# Patient Record
Sex: Male | Born: 1935 | Race: White | Hispanic: No | Marital: Married | Smoking: Former smoker
Health system: Southern US, Community
[De-identification: ages and names within clinical notes are randomized; demographics above are authoritative.]

## PROBLEM LIST (undated history)

## (undated) DIAGNOSIS — E785 Hyperlipidemia, unspecified: Secondary | ICD-10-CM

## (undated) DIAGNOSIS — I10 Essential (primary) hypertension: Secondary | ICD-10-CM

## (undated) HISTORY — DX: Essential (primary) hypertension: I10

## (undated) HISTORY — DX: Hyperlipidemia, unspecified: E78.5

## (undated) HISTORY — PX: BACK SURGERY: SHX140

---

## 2011-05-13 DIAGNOSIS — M25519 Pain in unspecified shoulder: Secondary | ICD-10-CM | POA: Diagnosis not present

## 2011-05-15 DIAGNOSIS — M25519 Pain in unspecified shoulder: Secondary | ICD-10-CM | POA: Diagnosis not present

## 2011-05-16 DIAGNOSIS — Z23 Encounter for immunization: Secondary | ICD-10-CM | POA: Diagnosis not present

## 2011-05-22 DIAGNOSIS — M25519 Pain in unspecified shoulder: Secondary | ICD-10-CM | POA: Diagnosis not present

## 2011-05-26 DIAGNOSIS — M25519 Pain in unspecified shoulder: Secondary | ICD-10-CM | POA: Diagnosis not present

## 2011-05-30 DIAGNOSIS — M25519 Pain in unspecified shoulder: Secondary | ICD-10-CM | POA: Diagnosis not present

## 2011-06-02 DIAGNOSIS — M25569 Pain in unspecified knee: Secondary | ICD-10-CM | POA: Diagnosis not present

## 2011-06-02 DIAGNOSIS — M25519 Pain in unspecified shoulder: Secondary | ICD-10-CM | POA: Diagnosis not present

## 2011-06-05 DIAGNOSIS — M25519 Pain in unspecified shoulder: Secondary | ICD-10-CM | POA: Diagnosis not present

## 2011-06-09 DIAGNOSIS — M25519 Pain in unspecified shoulder: Secondary | ICD-10-CM | POA: Diagnosis not present

## 2011-06-10 DIAGNOSIS — E785 Hyperlipidemia, unspecified: Secondary | ICD-10-CM | POA: Diagnosis not present

## 2011-06-10 DIAGNOSIS — Z125 Encounter for screening for malignant neoplasm of prostate: Secondary | ICD-10-CM | POA: Diagnosis not present

## 2011-06-10 DIAGNOSIS — I10 Essential (primary) hypertension: Secondary | ICD-10-CM | POA: Diagnosis not present

## 2011-06-12 DIAGNOSIS — M25519 Pain in unspecified shoulder: Secondary | ICD-10-CM | POA: Diagnosis not present

## 2011-06-17 DIAGNOSIS — E669 Obesity, unspecified: Secondary | ICD-10-CM | POA: Diagnosis not present

## 2011-06-17 DIAGNOSIS — Z0000 Encounter for general adult medical examination without abnormal findings: Secondary | ICD-10-CM | POA: Diagnosis not present

## 2011-06-17 DIAGNOSIS — R5381 Other malaise: Secondary | ICD-10-CM | POA: Diagnosis not present

## 2011-06-17 DIAGNOSIS — R5383 Other fatigue: Secondary | ICD-10-CM | POA: Diagnosis not present

## 2011-06-17 DIAGNOSIS — Z125 Encounter for screening for malignant neoplasm of prostate: Secondary | ICD-10-CM | POA: Diagnosis not present

## 2011-06-18 DIAGNOSIS — Z1212 Encounter for screening for malignant neoplasm of rectum: Secondary | ICD-10-CM | POA: Diagnosis not present

## 2011-10-30 DIAGNOSIS — H40059 Ocular hypertension, unspecified eye: Secondary | ICD-10-CM | POA: Diagnosis not present

## 2011-10-30 DIAGNOSIS — H01009 Unspecified blepharitis unspecified eye, unspecified eyelid: Secondary | ICD-10-CM | POA: Diagnosis not present

## 2011-10-30 DIAGNOSIS — H40019 Open angle with borderline findings, low risk, unspecified eye: Secondary | ICD-10-CM | POA: Diagnosis not present

## 2011-10-30 DIAGNOSIS — H259 Unspecified age-related cataract: Secondary | ICD-10-CM | POA: Diagnosis not present

## 2011-10-31 DIAGNOSIS — H40059 Ocular hypertension, unspecified eye: Secondary | ICD-10-CM | POA: Diagnosis not present

## 2011-12-29 DIAGNOSIS — H401490 Capsular glaucoma with pseudoexfoliation of lens, unspecified eye, stage unspecified: Secondary | ICD-10-CM | POA: Diagnosis not present

## 2011-12-29 DIAGNOSIS — H5340 Unspecified visual field defects: Secondary | ICD-10-CM | POA: Diagnosis not present

## 2011-12-29 DIAGNOSIS — H409 Unspecified glaucoma: Secondary | ICD-10-CM | POA: Diagnosis not present

## 2011-12-29 DIAGNOSIS — H259 Unspecified age-related cataract: Secondary | ICD-10-CM | POA: Diagnosis not present

## 2012-01-28 DIAGNOSIS — H409 Unspecified glaucoma: Secondary | ICD-10-CM | POA: Diagnosis not present

## 2012-01-28 DIAGNOSIS — H4011X0 Primary open-angle glaucoma, stage unspecified: Secondary | ICD-10-CM | POA: Diagnosis not present

## 2012-01-28 DIAGNOSIS — T887XXA Unspecified adverse effect of drug or medicament, initial encounter: Secondary | ICD-10-CM | POA: Diagnosis not present

## 2012-01-28 DIAGNOSIS — H268 Other specified cataract: Secondary | ICD-10-CM | POA: Diagnosis not present

## 2012-05-20 DIAGNOSIS — H04229 Epiphora due to insufficient drainage, unspecified lacrimal gland: Secondary | ICD-10-CM | POA: Diagnosis not present

## 2012-05-20 DIAGNOSIS — H353290 Exudative age-related macular degeneration, unspecified eye, stage unspecified: Secondary | ICD-10-CM | POA: Diagnosis not present

## 2012-05-20 DIAGNOSIS — H16109 Unspecified superficial keratitis, unspecified eye: Secondary | ICD-10-CM | POA: Diagnosis not present

## 2012-05-20 DIAGNOSIS — H01009 Unspecified blepharitis unspecified eye, unspecified eyelid: Secondary | ICD-10-CM | POA: Diagnosis not present

## 2012-10-14 DIAGNOSIS — H04229 Epiphora due to insufficient drainage, unspecified lacrimal gland: Secondary | ICD-10-CM | POA: Diagnosis not present

## 2012-10-14 DIAGNOSIS — H4011X0 Primary open-angle glaucoma, stage unspecified: Secondary | ICD-10-CM | POA: Diagnosis not present

## 2012-10-14 DIAGNOSIS — H409 Unspecified glaucoma: Secondary | ICD-10-CM | POA: Diagnosis not present

## 2012-10-14 DIAGNOSIS — H259 Unspecified age-related cataract: Secondary | ICD-10-CM | POA: Diagnosis not present

## 2013-04-13 DIAGNOSIS — H409 Unspecified glaucoma: Secondary | ICD-10-CM | POA: Diagnosis not present

## 2013-04-13 DIAGNOSIS — H4011X0 Primary open-angle glaucoma, stage unspecified: Secondary | ICD-10-CM | POA: Diagnosis not present

## 2013-09-07 DIAGNOSIS — M545 Low back pain, unspecified: Secondary | ICD-10-CM | POA: Diagnosis not present

## 2013-09-07 DIAGNOSIS — M48061 Spinal stenosis, lumbar region without neurogenic claudication: Secondary | ICD-10-CM | POA: Diagnosis not present

## 2013-09-07 DIAGNOSIS — M5137 Other intervertebral disc degeneration, lumbosacral region: Secondary | ICD-10-CM | POA: Diagnosis not present

## 2013-09-28 DIAGNOSIS — H4011X0 Primary open-angle glaucoma, stage unspecified: Secondary | ICD-10-CM | POA: Diagnosis not present

## 2013-09-28 DIAGNOSIS — H409 Unspecified glaucoma: Secondary | ICD-10-CM | POA: Diagnosis not present

## 2013-09-29 DIAGNOSIS — M5137 Other intervertebral disc degeneration, lumbosacral region: Secondary | ICD-10-CM | POA: Diagnosis not present

## 2013-09-29 DIAGNOSIS — M545 Low back pain, unspecified: Secondary | ICD-10-CM | POA: Diagnosis not present

## 2013-09-29 DIAGNOSIS — M48061 Spinal stenosis, lumbar region without neurogenic claudication: Secondary | ICD-10-CM | POA: Diagnosis not present

## 2013-11-08 DIAGNOSIS — E785 Hyperlipidemia, unspecified: Secondary | ICD-10-CM | POA: Diagnosis not present

## 2013-11-08 DIAGNOSIS — Z1212 Encounter for screening for malignant neoplasm of rectum: Secondary | ICD-10-CM | POA: Diagnosis not present

## 2013-11-08 DIAGNOSIS — I10 Essential (primary) hypertension: Secondary | ICD-10-CM | POA: Diagnosis not present

## 2013-11-08 DIAGNOSIS — Z125 Encounter for screening for malignant neoplasm of prostate: Secondary | ICD-10-CM | POA: Diagnosis not present

## 2013-11-14 DIAGNOSIS — Z1212 Encounter for screening for malignant neoplasm of rectum: Secondary | ICD-10-CM | POA: Diagnosis not present

## 2013-11-15 DIAGNOSIS — E785 Hyperlipidemia, unspecified: Secondary | ICD-10-CM | POA: Diagnosis not present

## 2013-11-15 DIAGNOSIS — I10 Essential (primary) hypertension: Secondary | ICD-10-CM | POA: Diagnosis not present

## 2013-11-15 DIAGNOSIS — M1990 Unspecified osteoarthritis, unspecified site: Secondary | ICD-10-CM | POA: Diagnosis not present

## 2013-11-15 DIAGNOSIS — Z0000 Encounter for general adult medical examination without abnormal findings: Secondary | ICD-10-CM | POA: Diagnosis not present

## 2013-11-15 DIAGNOSIS — Z23 Encounter for immunization: Secondary | ICD-10-CM | POA: Diagnosis not present

## 2013-11-15 DIAGNOSIS — N529 Male erectile dysfunction, unspecified: Secondary | ICD-10-CM | POA: Diagnosis not present

## 2013-11-15 DIAGNOSIS — K5730 Diverticulosis of large intestine without perforation or abscess without bleeding: Secondary | ICD-10-CM | POA: Diagnosis not present

## 2013-11-15 DIAGNOSIS — E669 Obesity, unspecified: Secondary | ICD-10-CM | POA: Diagnosis not present

## 2013-11-15 DIAGNOSIS — Z1331 Encounter for screening for depression: Secondary | ICD-10-CM | POA: Diagnosis not present

## 2013-11-15 DIAGNOSIS — Z125 Encounter for screening for malignant neoplasm of prostate: Secondary | ICD-10-CM | POA: Diagnosis not present

## 2014-03-01 DIAGNOSIS — S161XXA Strain of muscle, fascia and tendon at neck level, initial encounter: Secondary | ICD-10-CM | POA: Diagnosis not present

## 2014-03-01 DIAGNOSIS — M542 Cervicalgia: Secondary | ICD-10-CM | POA: Diagnosis not present

## 2014-03-02 DIAGNOSIS — Z23 Encounter for immunization: Secondary | ICD-10-CM | POA: Diagnosis not present

## 2014-03-09 DIAGNOSIS — H2589 Other age-related cataract: Secondary | ICD-10-CM | POA: Diagnosis not present

## 2014-03-09 DIAGNOSIS — H4011X1 Primary open-angle glaucoma, mild stage: Secondary | ICD-10-CM | POA: Diagnosis not present

## 2014-03-09 DIAGNOSIS — H04123 Dry eye syndrome of bilateral lacrimal glands: Secondary | ICD-10-CM | POA: Diagnosis not present

## 2014-03-09 DIAGNOSIS — H2513 Age-related nuclear cataract, bilateral: Secondary | ICD-10-CM | POA: Diagnosis not present

## 2014-03-10 DIAGNOSIS — M436 Torticollis: Secondary | ICD-10-CM | POA: Diagnosis not present

## 2014-03-21 DIAGNOSIS — M436 Torticollis: Secondary | ICD-10-CM | POA: Diagnosis not present

## 2014-03-27 DIAGNOSIS — M5030 Other cervical disc degeneration, unspecified cervical region: Secondary | ICD-10-CM | POA: Diagnosis not present

## 2014-03-27 DIAGNOSIS — M436 Torticollis: Secondary | ICD-10-CM | POA: Diagnosis not present

## 2014-03-27 DIAGNOSIS — M542 Cervicalgia: Secondary | ICD-10-CM | POA: Diagnosis not present

## 2014-04-13 DIAGNOSIS — H40052 Ocular hypertension, left eye: Secondary | ICD-10-CM | POA: Diagnosis not present

## 2014-04-13 DIAGNOSIS — H4011X1 Primary open-angle glaucoma, mild stage: Secondary | ICD-10-CM | POA: Diagnosis not present

## 2014-04-13 DIAGNOSIS — H2513 Age-related nuclear cataract, bilateral: Secondary | ICD-10-CM | POA: Diagnosis not present

## 2014-05-25 DIAGNOSIS — H01001 Unspecified blepharitis right upper eyelid: Secondary | ICD-10-CM | POA: Diagnosis not present

## 2014-05-25 DIAGNOSIS — H01004 Unspecified blepharitis left upper eyelid: Secondary | ICD-10-CM | POA: Diagnosis not present

## 2014-05-25 DIAGNOSIS — H2513 Age-related nuclear cataract, bilateral: Secondary | ICD-10-CM | POA: Diagnosis not present

## 2014-05-25 DIAGNOSIS — H4011X1 Primary open-angle glaucoma, mild stage: Secondary | ICD-10-CM | POA: Diagnosis not present

## 2014-07-10 DIAGNOSIS — H4011X1 Primary open-angle glaucoma, mild stage: Secondary | ICD-10-CM | POA: Diagnosis not present

## 2014-07-10 DIAGNOSIS — H16102 Unspecified superficial keratitis, left eye: Secondary | ICD-10-CM | POA: Diagnosis not present

## 2014-07-10 DIAGNOSIS — H2589 Other age-related cataract: Secondary | ICD-10-CM | POA: Diagnosis not present

## 2014-07-10 DIAGNOSIS — H2513 Age-related nuclear cataract, bilateral: Secondary | ICD-10-CM | POA: Diagnosis not present

## 2014-10-23 DIAGNOSIS — H4011X1 Primary open-angle glaucoma, mild stage: Secondary | ICD-10-CM | POA: Diagnosis not present

## 2014-10-23 DIAGNOSIS — H40051 Ocular hypertension, right eye: Secondary | ICD-10-CM | POA: Diagnosis not present

## 2014-10-23 DIAGNOSIS — H1131 Conjunctival hemorrhage, right eye: Secondary | ICD-10-CM | POA: Diagnosis not present

## 2014-11-21 DIAGNOSIS — E785 Hyperlipidemia, unspecified: Secondary | ICD-10-CM | POA: Diagnosis not present

## 2014-11-21 DIAGNOSIS — I10 Essential (primary) hypertension: Secondary | ICD-10-CM | POA: Diagnosis not present

## 2014-11-21 DIAGNOSIS — Z125 Encounter for screening for malignant neoplasm of prostate: Secondary | ICD-10-CM | POA: Diagnosis not present

## 2014-11-28 ENCOUNTER — Other Ambulatory Visit: Payer: Self-pay | Admitting: Internal Medicine

## 2014-11-28 DIAGNOSIS — E669 Obesity, unspecified: Secondary | ICD-10-CM | POA: Diagnosis not present

## 2014-11-28 DIAGNOSIS — Z1389 Encounter for screening for other disorder: Secondary | ICD-10-CM | POA: Diagnosis not present

## 2014-11-28 DIAGNOSIS — H40009 Preglaucoma, unspecified, unspecified eye: Secondary | ICD-10-CM | POA: Diagnosis not present

## 2014-11-28 DIAGNOSIS — E785 Hyperlipidemia, unspecified: Secondary | ICD-10-CM | POA: Diagnosis not present

## 2014-11-28 DIAGNOSIS — Z0000 Encounter for general adult medical examination without abnormal findings: Secondary | ICD-10-CM | POA: Diagnosis not present

## 2014-11-28 DIAGNOSIS — I10 Essential (primary) hypertension: Secondary | ICD-10-CM | POA: Diagnosis not present

## 2014-11-28 DIAGNOSIS — M1990 Unspecified osteoarthritis, unspecified site: Secondary | ICD-10-CM | POA: Diagnosis not present

## 2014-11-28 DIAGNOSIS — H6122 Impacted cerumen, left ear: Secondary | ICD-10-CM | POA: Diagnosis not present

## 2014-11-28 DIAGNOSIS — N529 Male erectile dysfunction, unspecified: Secondary | ICD-10-CM | POA: Diagnosis not present

## 2014-11-28 DIAGNOSIS — Z139 Encounter for screening, unspecified: Secondary | ICD-10-CM

## 2014-11-28 DIAGNOSIS — Z6831 Body mass index (BMI) 31.0-31.9, adult: Secondary | ICD-10-CM | POA: Diagnosis not present

## 2014-12-01 ENCOUNTER — Ambulatory Visit
Admission: RE | Admit: 2014-12-01 | Discharge: 2014-12-01 | Disposition: A | Discharge status: Home / Self Care | Payer: No Typology Code available for payment source | Source: Ambulatory Visit | Attending: Internal Medicine | Admitting: Internal Medicine

## 2014-12-01 DIAGNOSIS — Z139 Encounter for screening, unspecified: Secondary | ICD-10-CM

## 2014-12-01 DIAGNOSIS — Z1212 Encounter for screening for malignant neoplasm of rectum: Secondary | ICD-10-CM | POA: Diagnosis not present

## 2015-01-20 DIAGNOSIS — Z23 Encounter for immunization: Secondary | ICD-10-CM | POA: Diagnosis not present

## 2015-03-02 DIAGNOSIS — H01001 Unspecified blepharitis right upper eyelid: Secondary | ICD-10-CM | POA: Diagnosis not present

## 2015-03-02 DIAGNOSIS — H25813 Combined forms of age-related cataract, bilateral: Secondary | ICD-10-CM | POA: Diagnosis not present

## 2015-03-02 DIAGNOSIS — H401411 Capsular glaucoma with pseudoexfoliation of lens, right eye, mild stage: Secondary | ICD-10-CM | POA: Diagnosis not present

## 2015-03-02 DIAGNOSIS — H401422 Capsular glaucoma with pseudoexfoliation of lens, left eye, moderate stage: Secondary | ICD-10-CM | POA: Diagnosis not present

## 2015-04-02 DIAGNOSIS — H401431 Capsular glaucoma with pseudoexfoliation of lens, bilateral, mild stage: Secondary | ICD-10-CM | POA: Diagnosis not present

## 2015-04-02 DIAGNOSIS — H01001 Unspecified blepharitis right upper eyelid: Secondary | ICD-10-CM | POA: Diagnosis not present

## 2015-04-02 DIAGNOSIS — H25813 Combined forms of age-related cataract, bilateral: Secondary | ICD-10-CM | POA: Diagnosis not present

## 2015-04-02 DIAGNOSIS — H04123 Dry eye syndrome of bilateral lacrimal glands: Secondary | ICD-10-CM | POA: Diagnosis not present

## 2015-04-25 DIAGNOSIS — H401412 Capsular glaucoma with pseudoexfoliation of lens, right eye, moderate stage: Secondary | ICD-10-CM | POA: Diagnosis not present

## 2015-04-25 DIAGNOSIS — H401411 Capsular glaucoma with pseudoexfoliation of lens, right eye, mild stage: Secondary | ICD-10-CM | POA: Diagnosis not present

## 2015-04-25 DIAGNOSIS — H25813 Combined forms of age-related cataract, bilateral: Secondary | ICD-10-CM | POA: Diagnosis not present

## 2015-06-04 DIAGNOSIS — H5213 Myopia, bilateral: Secondary | ICD-10-CM | POA: Diagnosis not present

## 2015-06-04 DIAGNOSIS — H401431 Capsular glaucoma with pseudoexfoliation of lens, bilateral, mild stage: Secondary | ICD-10-CM | POA: Diagnosis not present

## 2015-06-04 DIAGNOSIS — H524 Presbyopia: Secondary | ICD-10-CM | POA: Diagnosis not present

## 2015-06-04 DIAGNOSIS — H25813 Combined forms of age-related cataract, bilateral: Secondary | ICD-10-CM | POA: Diagnosis not present

## 2015-10-08 DIAGNOSIS — H401431 Capsular glaucoma with pseudoexfoliation of lens, bilateral, mild stage: Secondary | ICD-10-CM | POA: Diagnosis not present

## 2015-12-31 DIAGNOSIS — H16102 Unspecified superficial keratitis, left eye: Secondary | ICD-10-CM | POA: Diagnosis not present

## 2015-12-31 DIAGNOSIS — H04123 Dry eye syndrome of bilateral lacrimal glands: Secondary | ICD-10-CM | POA: Diagnosis not present

## 2015-12-31 DIAGNOSIS — H401431 Capsular glaucoma with pseudoexfoliation of lens, bilateral, mild stage: Secondary | ICD-10-CM | POA: Diagnosis not present

## 2016-01-31 DIAGNOSIS — I10 Essential (primary) hypertension: Secondary | ICD-10-CM | POA: Diagnosis not present

## 2016-01-31 DIAGNOSIS — Z125 Encounter for screening for malignant neoplasm of prostate: Secondary | ICD-10-CM | POA: Diagnosis not present

## 2016-01-31 DIAGNOSIS — E784 Other hyperlipidemia: Secondary | ICD-10-CM | POA: Diagnosis not present

## 2016-02-06 DIAGNOSIS — Z1212 Encounter for screening for malignant neoplasm of rectum: Secondary | ICD-10-CM | POA: Diagnosis not present

## 2016-02-07 DIAGNOSIS — H40009 Preglaucoma, unspecified, unspecified eye: Secondary | ICD-10-CM | POA: Diagnosis not present

## 2016-02-07 DIAGNOSIS — I10 Essential (primary) hypertension: Secondary | ICD-10-CM | POA: Diagnosis not present

## 2016-02-07 DIAGNOSIS — Z1389 Encounter for screening for other disorder: Secondary | ICD-10-CM | POA: Diagnosis not present

## 2016-02-07 DIAGNOSIS — M1990 Unspecified osteoarthritis, unspecified site: Secondary | ICD-10-CM | POA: Diagnosis not present

## 2016-02-07 DIAGNOSIS — E784 Other hyperlipidemia: Secondary | ICD-10-CM | POA: Diagnosis not present

## 2016-02-07 DIAGNOSIS — N528 Other male erectile dysfunction: Secondary | ICD-10-CM | POA: Diagnosis not present

## 2016-02-07 DIAGNOSIS — K5790 Diverticulosis of intestine, part unspecified, without perforation or abscess without bleeding: Secondary | ICD-10-CM | POA: Diagnosis not present

## 2016-02-07 DIAGNOSIS — Z0000 Encounter for general adult medical examination without abnormal findings: Secondary | ICD-10-CM | POA: Diagnosis not present

## 2016-02-07 DIAGNOSIS — Z6832 Body mass index (BMI) 32.0-32.9, adult: Secondary | ICD-10-CM | POA: Diagnosis not present

## 2016-02-07 DIAGNOSIS — L308 Other specified dermatitis: Secondary | ICD-10-CM | POA: Diagnosis not present

## 2016-02-07 DIAGNOSIS — E668 Other obesity: Secondary | ICD-10-CM | POA: Diagnosis not present

## 2016-02-07 DIAGNOSIS — Z23 Encounter for immunization: Secondary | ICD-10-CM | POA: Diagnosis not present

## 2016-05-15 DIAGNOSIS — H25813 Combined forms of age-related cataract, bilateral: Secondary | ICD-10-CM | POA: Diagnosis not present

## 2016-05-15 DIAGNOSIS — H401431 Capsular glaucoma with pseudoexfoliation of lens, bilateral, mild stage: Secondary | ICD-10-CM | POA: Diagnosis not present

## 2016-05-15 DIAGNOSIS — Z0100 Encounter for examination of eyes and vision without abnormal findings: Secondary | ICD-10-CM | POA: Diagnosis not present

## 2016-05-15 DIAGNOSIS — H04123 Dry eye syndrome of bilateral lacrimal glands: Secondary | ICD-10-CM | POA: Diagnosis not present

## 2016-06-12 DIAGNOSIS — H25813 Combined forms of age-related cataract, bilateral: Secondary | ICD-10-CM | POA: Diagnosis not present

## 2016-06-12 DIAGNOSIS — H401431 Capsular glaucoma with pseudoexfoliation of lens, bilateral, mild stage: Secondary | ICD-10-CM | POA: Diagnosis not present

## 2016-07-10 DIAGNOSIS — H25813 Combined forms of age-related cataract, bilateral: Secondary | ICD-10-CM | POA: Diagnosis not present

## 2016-07-10 DIAGNOSIS — H401431 Capsular glaucoma with pseudoexfoliation of lens, bilateral, mild stage: Secondary | ICD-10-CM | POA: Diagnosis not present

## 2016-11-18 DIAGNOSIS — H401431 Capsular glaucoma with pseudoexfoliation of lens, bilateral, mild stage: Secondary | ICD-10-CM | POA: Diagnosis not present

## 2016-12-01 DIAGNOSIS — H25813 Combined forms of age-related cataract, bilateral: Secondary | ICD-10-CM | POA: Diagnosis not present

## 2016-12-01 DIAGNOSIS — H5340 Unspecified visual field defects: Secondary | ICD-10-CM | POA: Diagnosis not present

## 2016-12-01 DIAGNOSIS — H401432 Capsular glaucoma with pseudoexfoliation of lens, bilateral, moderate stage: Secondary | ICD-10-CM | POA: Diagnosis not present

## 2016-12-07 IMAGING — CT CT HEART SCORING
1 of 2 series · 11 of 20 positions shown, 14 images · non-contrast
Comparison: No priors.

CLINICAL DATA: 80-year-old male with family history of coronary
artery disease in father and sister. Former smoker (quit 35 x 40
years ago).

EXAM:
CT HEART FOR CALCIUM SCORING
TECHNIQUE: CT heart was performed on a 64 channel system using prospective ECG
gating. A scout and noncontrast exam (for calcium scoring) were
performed. Note that this exam targets the heart and the chest was
not imaged in its entirety.

[Series 2: smartscore - gated 0.4 sec · axial · 0.55mm/px · z∈[-256,-141]mm · 11 of 56 slices shown, 14 images]
[im 5/56  vessel]
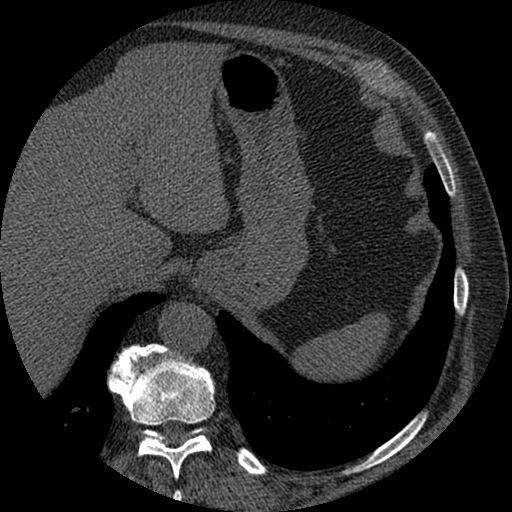
[im 5/56  lung]
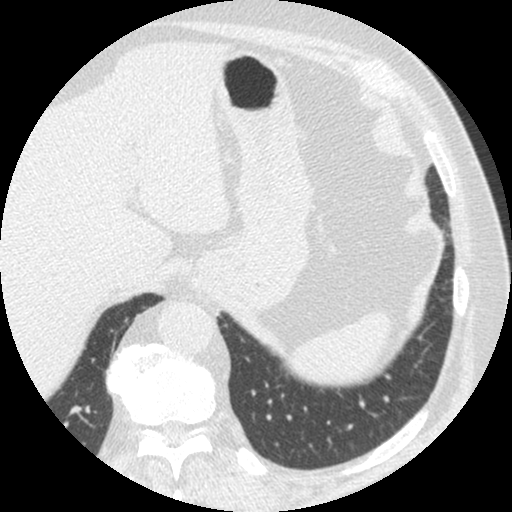
[im 10/56  vessel]
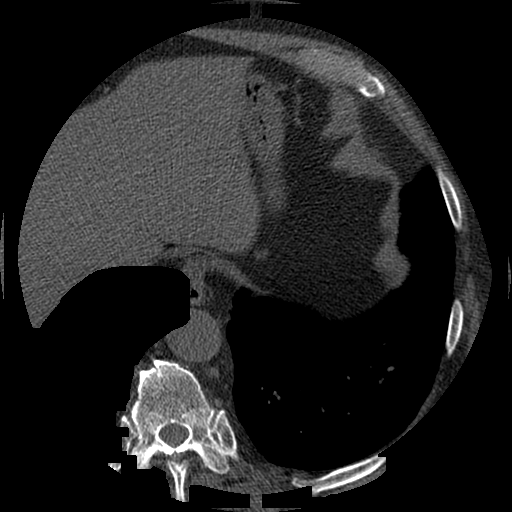
[im 14/56  vessel]
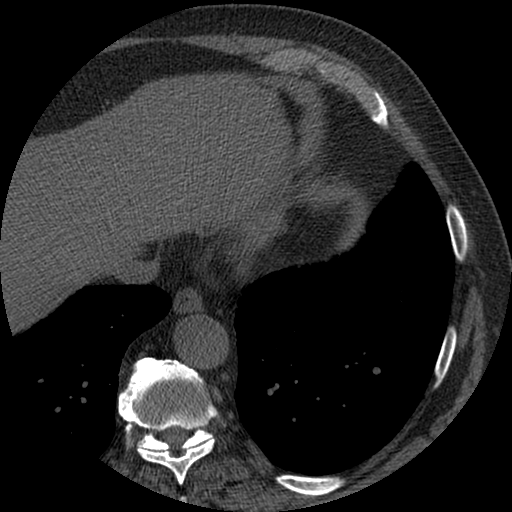
[im 19/56  vessel]
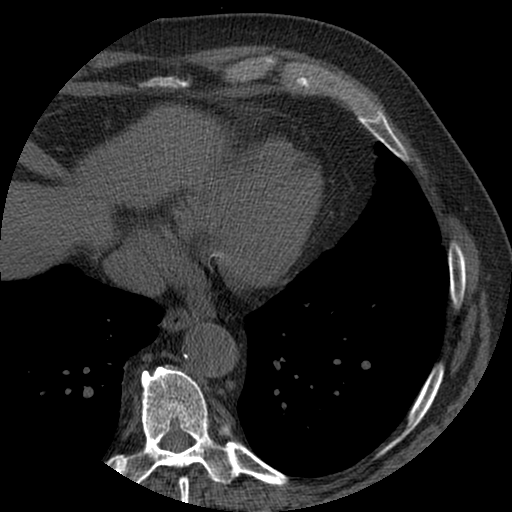
[im 23/56  vessel]
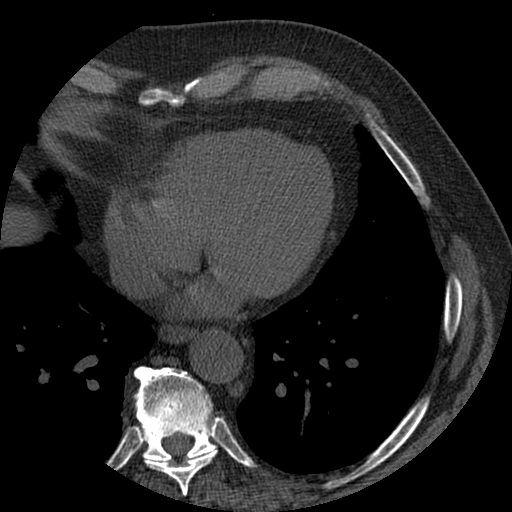
[im 23/56  lung]
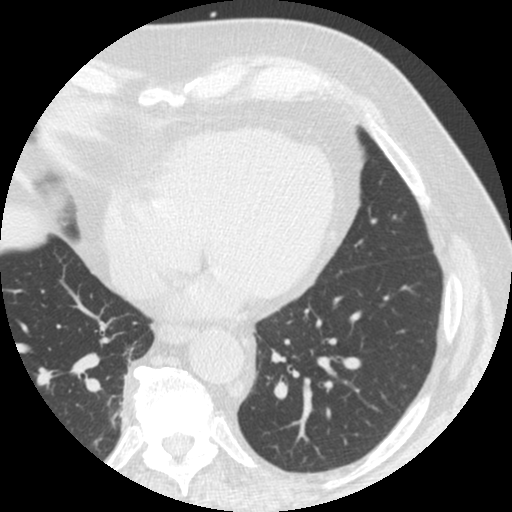
[im 28/56  vessel]
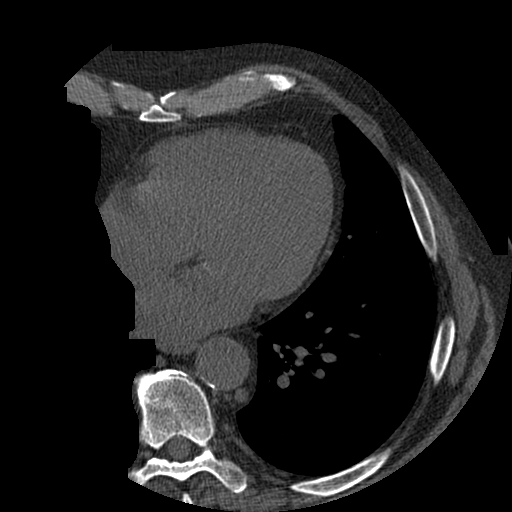
[im 33/56  vessel]
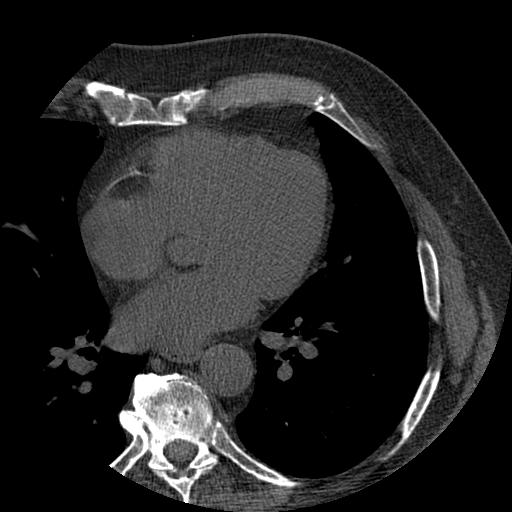
[im 37/56  vessel]
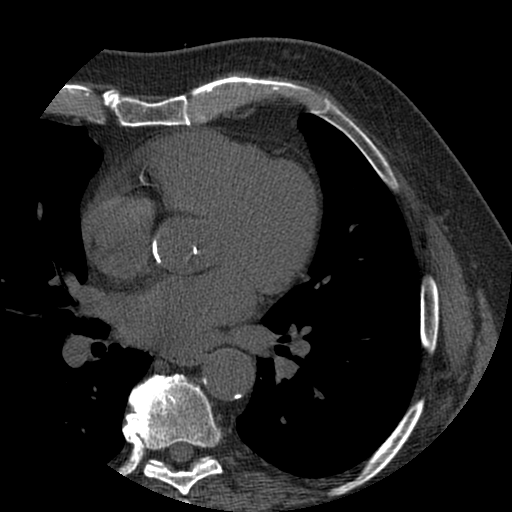
[im 42/56  vessel]
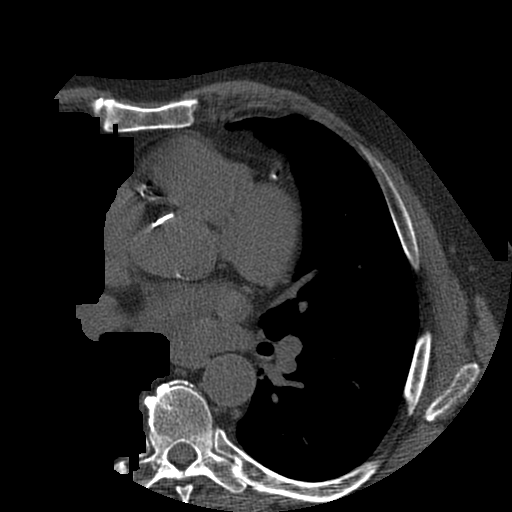
[im 42/56  lung]
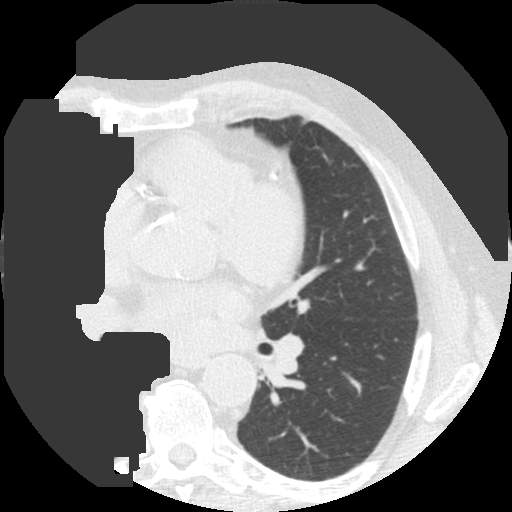
[im 46/56  vessel]
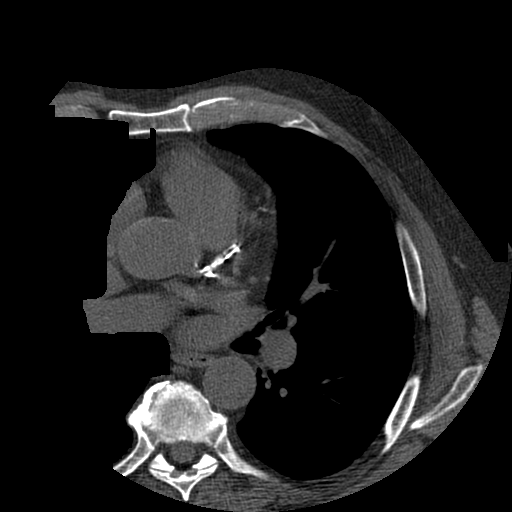
[im 51/56  vessel]
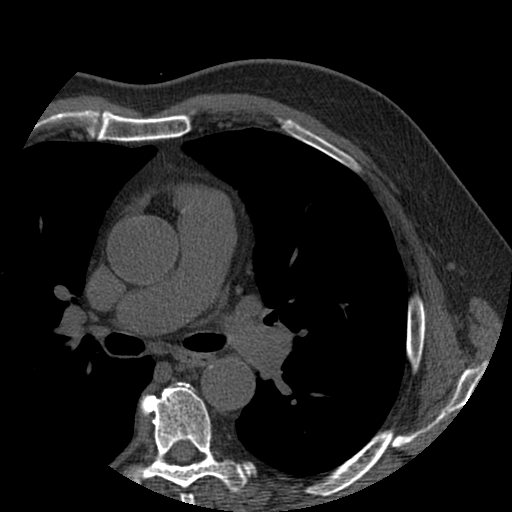

[11 of 20 positions shown; findings below may reference images not displayed]

FINDINGS: Technical quality: Excellent.

CORONARY CALCIUM

Total Agatston Score: 1,383

[HOSPITAL] percentile:  79th

OTHER FINDINGS:

Heart size is normal. There is no significant pericardial fluid,
thickening or pericardial calcification. Calcifications of the
aortic valve. Within the visualized portions of the thorax there are
no suspicious appearing pulmonary nodules or masses, there is no
acute consolidative airspace disease, no pleural effusion, no
pneumothorax and no lymphadenopathy. Visualized portions of the
upper abdomen are remarkable for diffuse low-attenuation in the
hepatic parenchyma, compatible with hepatic steatosis. There are no
aggressive appearing lytic or blastic lesions noted in the
visualized portions of the skeleton.
IMPRESSION: 1. Patient's total coronary artery calcium score is [DATE], which is
seventy-ninth percentile for patient's of matched age, gender and
race/ethnicity.
2. There are calcifications of the aortic valve. Echocardiographic
correlation for evaluation of potential valvular dysfunction may be
warranted if clinically indicated.
3. No significant incidental noncardiac findings noted.

## 2017-02-02 DIAGNOSIS — E7849 Other hyperlipidemia: Secondary | ICD-10-CM | POA: Diagnosis not present

## 2017-02-02 DIAGNOSIS — Z0000 Encounter for general adult medical examination without abnormal findings: Secondary | ICD-10-CM | POA: Diagnosis not present

## 2017-02-02 DIAGNOSIS — Z125 Encounter for screening for malignant neoplasm of prostate: Secondary | ICD-10-CM | POA: Diagnosis not present

## 2017-02-02 DIAGNOSIS — I10 Essential (primary) hypertension: Secondary | ICD-10-CM | POA: Diagnosis not present

## 2017-02-09 DIAGNOSIS — K5790 Diverticulosis of intestine, part unspecified, without perforation or abscess without bleeding: Secondary | ICD-10-CM | POA: Diagnosis not present

## 2017-02-09 DIAGNOSIS — E7849 Other hyperlipidemia: Secondary | ICD-10-CM | POA: Diagnosis not present

## 2017-02-09 DIAGNOSIS — Z1389 Encounter for screening for other disorder: Secondary | ICD-10-CM | POA: Diagnosis not present

## 2017-02-09 DIAGNOSIS — L308 Other specified dermatitis: Secondary | ICD-10-CM | POA: Diagnosis not present

## 2017-02-09 DIAGNOSIS — N528 Other male erectile dysfunction: Secondary | ICD-10-CM | POA: Diagnosis not present

## 2017-02-09 DIAGNOSIS — E668 Other obesity: Secondary | ICD-10-CM | POA: Diagnosis not present

## 2017-02-09 DIAGNOSIS — Z23 Encounter for immunization: Secondary | ICD-10-CM | POA: Diagnosis not present

## 2017-02-09 DIAGNOSIS — Z0000 Encounter for general adult medical examination without abnormal findings: Secondary | ICD-10-CM | POA: Diagnosis not present

## 2017-02-09 DIAGNOSIS — M1990 Unspecified osteoarthritis, unspecified site: Secondary | ICD-10-CM | POA: Diagnosis not present

## 2017-02-13 DIAGNOSIS — Z1212 Encounter for screening for malignant neoplasm of rectum: Secondary | ICD-10-CM | POA: Diagnosis not present

## 2017-04-16 DIAGNOSIS — H25813 Combined forms of age-related cataract, bilateral: Secondary | ICD-10-CM | POA: Diagnosis not present

## 2017-04-16 DIAGNOSIS — H401132 Primary open-angle glaucoma, bilateral, moderate stage: Secondary | ICD-10-CM | POA: Diagnosis not present

## 2017-04-16 DIAGNOSIS — H401432 Capsular glaucoma with pseudoexfoliation of lens, bilateral, moderate stage: Secondary | ICD-10-CM | POA: Diagnosis not present

## 2017-12-22 ENCOUNTER — Other Ambulatory Visit: Payer: Self-pay | Admitting: Internal Medicine

## 2017-12-23 ENCOUNTER — Other Ambulatory Visit: Payer: Self-pay | Admitting: Internal Medicine

## 2017-12-23 DIAGNOSIS — I714 Abdominal aortic aneurysm, without rupture, unspecified: Secondary | ICD-10-CM

## 2017-12-29 ENCOUNTER — Inpatient Hospital Stay
Admission: RE | Admit: 2017-12-29 | Discharge: 2017-12-29 | Disposition: A | Discharge status: Home / Self Care | Payer: No Typology Code available for payment source | Source: Ambulatory Visit | Attending: Internal Medicine | Admitting: Internal Medicine

## 2018-01-07 ENCOUNTER — Ambulatory Visit
Admission: RE | Admit: 2018-01-07 | Discharge: 2018-01-07 | Disposition: A | Discharge status: Home / Self Care | Payer: Medicare Other | Source: Ambulatory Visit | Attending: Internal Medicine | Admitting: Internal Medicine

## 2018-01-07 DIAGNOSIS — I714 Abdominal aortic aneurysm, without rupture, unspecified: Secondary | ICD-10-CM

## 2018-02-09 DIAGNOSIS — R82998 Other abnormal findings in urine: Secondary | ICD-10-CM | POA: Diagnosis not present

## 2020-06-15 IMAGING — US US AORTA
1 series · 11 of 11 positions shown · non-contrast
Comparison: None.

CLINICAL DATA: 84-year-old male for evaluation of abdominal aortic
aneurysm.

EXAM:
ULTRASOUND OF ABDOMINAL AORTA
TECHNIQUE: Ultrasound examination of the abdominal aorta was performed to
evaluate for abdominal aortic aneurysm.

[Series 1: us aorta · 0.33mm/px · 11 of 11 slices shown]
[im 1/11]
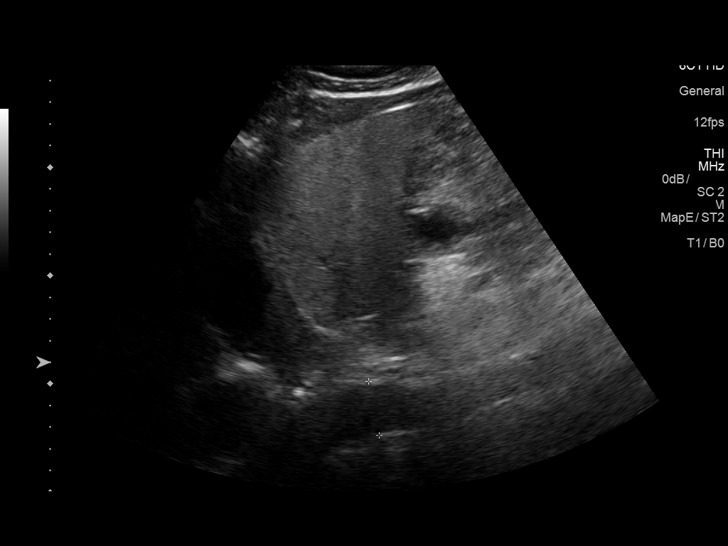
[im 2/11]
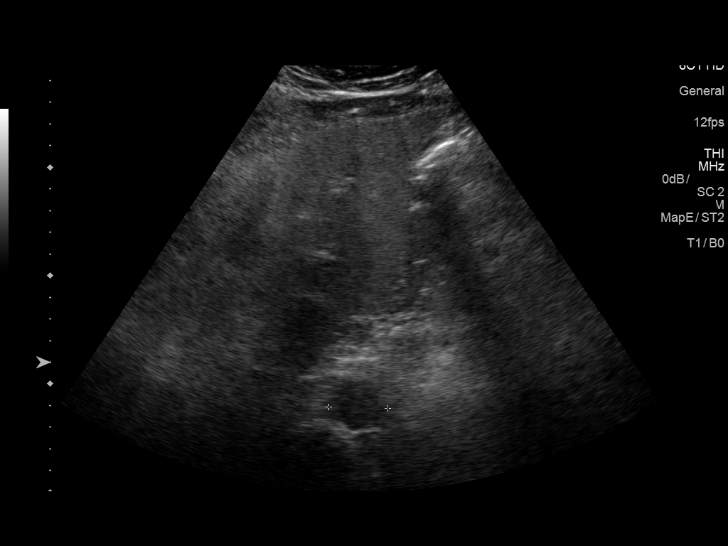
[im 3/11]
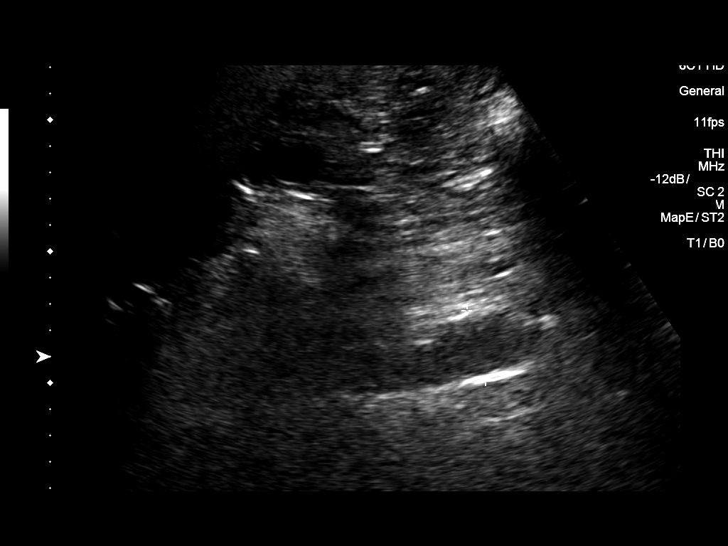
[im 4/11]
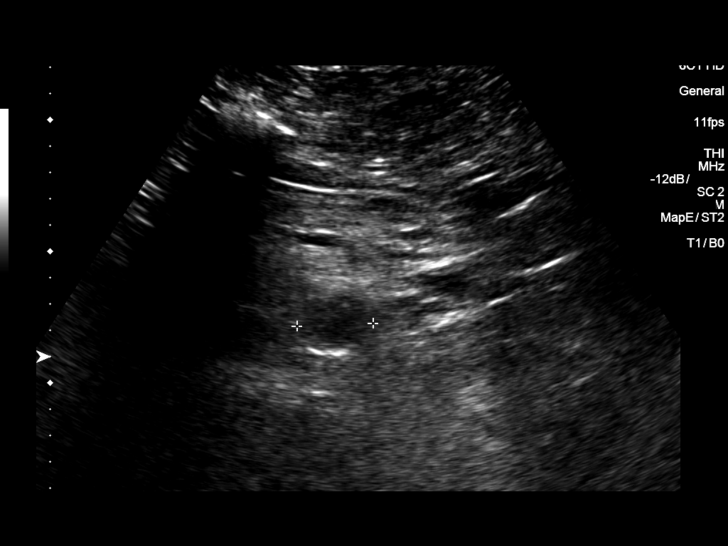
[im 5/11]
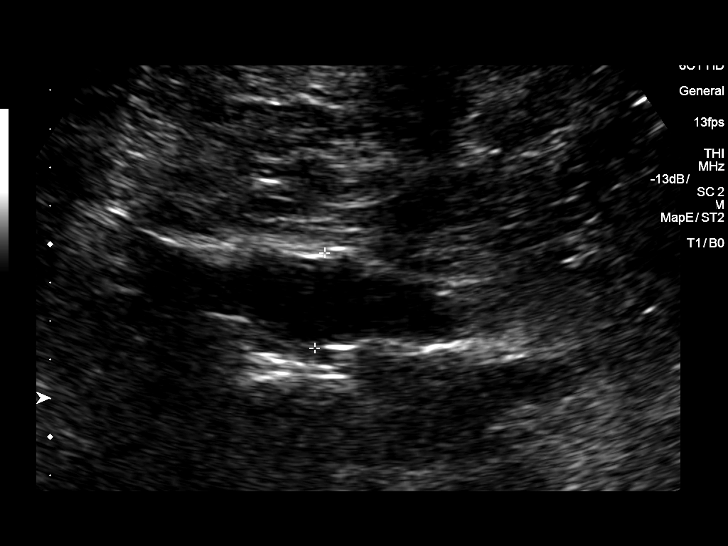
[im 6/11]
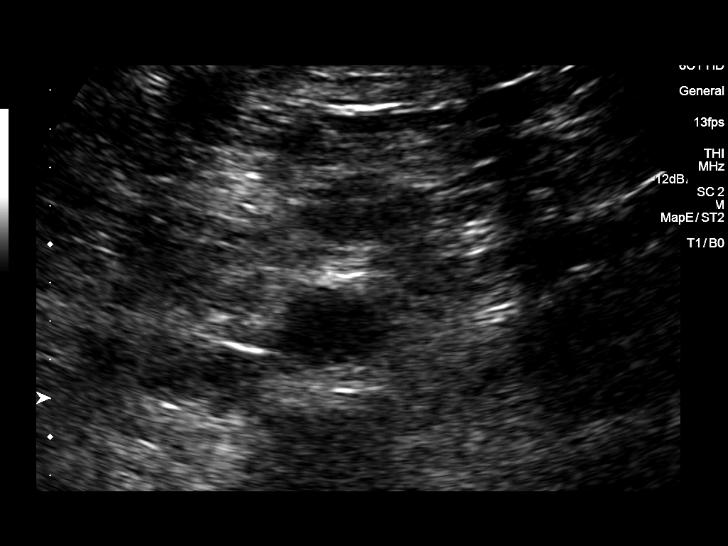
[im 7/11]
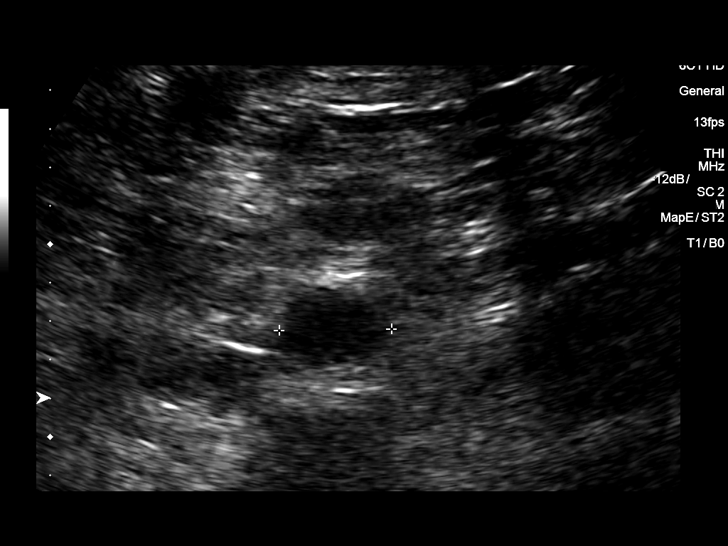
[im 8/11]
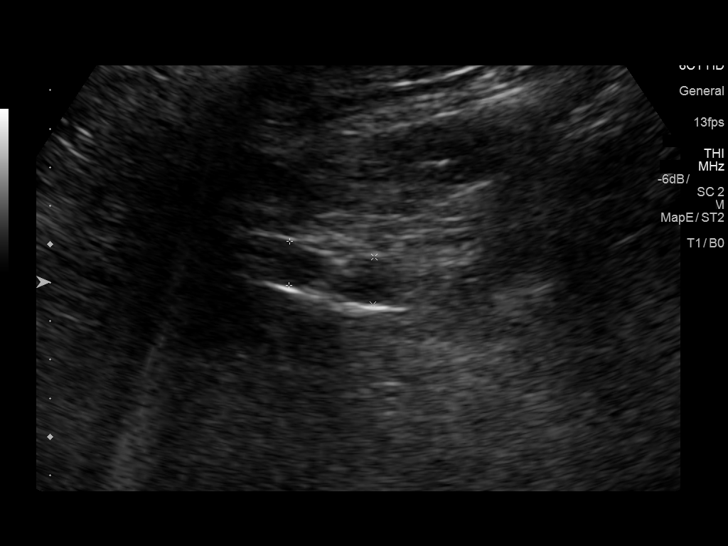
[im 9/11]
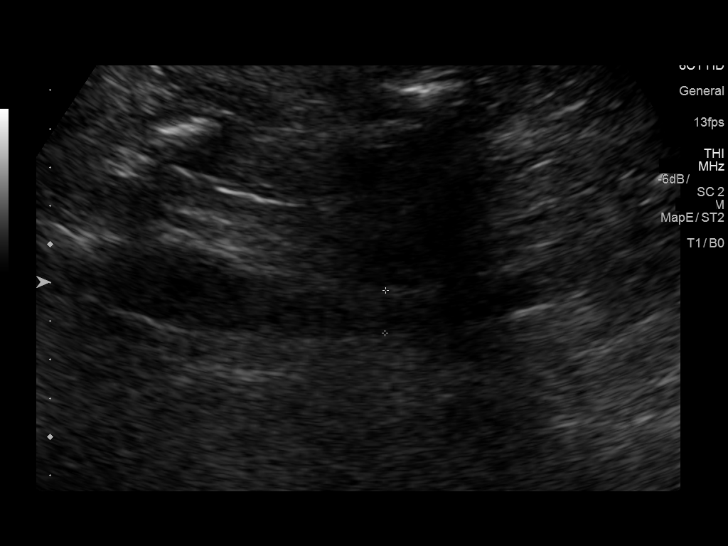
[im 10/11]
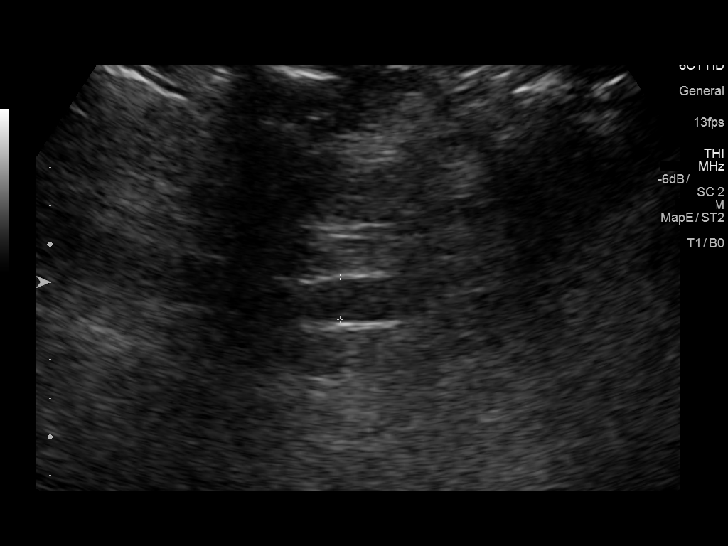
[im 11/11]
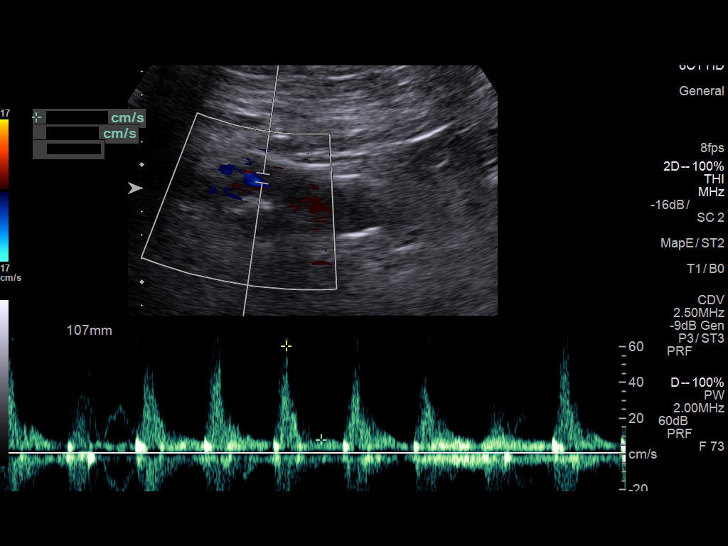

[11 of 11 positions shown; findings below may reference images not displayed]

FINDINGS: Abdominal aortic measurements as follows:

Proximal:  2.7 cm

Mid:  2.9 cm

Distal:  2.9 cm

Common iliac arteries bilaterally measuring 1.2 cm in greatest
diameter.
IMPRESSION: Ectatic abdominal aorta at risk for aneurysm development. Recommend
followup by ultrasound in 5 years. This recommendation follows ACR
consensus guidelines: White Paper of the ACR Incidental Findings
Committee II on Vascular Findings. [HOSPITAL] 5217;

## 2021-10-25 ENCOUNTER — Other Ambulatory Visit: Payer: Self-pay | Admitting: *Deleted

## 2021-10-25 DIAGNOSIS — I714 Abdominal aortic aneurysm, without rupture, unspecified: Secondary | ICD-10-CM

## 2021-11-01 ENCOUNTER — Encounter: Payer: Self-pay | Admitting: Surgery

## 2021-11-01 ENCOUNTER — Ambulatory Visit: Payer: Medicare Other | Admitting: Surgery

## 2021-11-01 ENCOUNTER — Ambulatory Visit (HOSPITAL_COMMUNITY)
Admission: RE | Admit: 2021-11-01 | Discharge: 2021-11-01 | Disposition: A | Discharge status: Home / Self Care | Payer: Medicare Other | Source: Ambulatory Visit | Attending: Surgery | Admitting: Surgery

## 2021-11-01 VITALS — BP 147/74 | HR 60 | Temp 97.8°F | Resp 20 | Ht 72.0 in | Wt 231.5 lb

## 2021-11-01 DIAGNOSIS — I714 Abdominal aortic aneurysm, without rupture, unspecified: Secondary | ICD-10-CM | POA: Insufficient documentation

## 2021-11-01 DIAGNOSIS — I7143 Infrarenal abdominal aortic aneurysm, without rupture: Secondary | ICD-10-CM | POA: Diagnosis not present

## 2021-11-01 NOTE — Progress Notes (Signed)
Vascular and Vein Specialist of [facility]  Patient name: Louis Cruz MRN: [id number] DOB: 26-Jan-1934 Sex: male   REQUESTING PROVIDER:    Dr. Shelle Iron   REASON FOR CONSULT:    AAA  HISTORY OF PRESENT ILLNESS:   Louis Cruz is a 88 y.o. male, who is referred for evaluation of an abdominal aortic aneurysm.  He had a ultrasound screening in 2019 which showed a 2.9 cm aneurysm.  He recently had a CT scan at [facility] for back pain that he suffered [place] the [place] that is now completely resolved, and this showed a greater than 3 cm aneurysm.  He is referred for further evaluation.  He does have a sister that had a abdominal aortic aneurysm.  He is currently without abdominal pain.  Patient is medically managed for hypertension with an ACE inhibitor.  He is on a statin for hypercholesterolemia.  He has a remote history of smoking  PAST MEDICAL HISTORY    History reviewed. No pertinent past medical history.   FAMILY HISTORY   History reviewed. No pertinent family history.  SOCIAL HISTORY:   Social History   Socioeconomic History   Marital status: Married    Spouse name: Not on file   Number of children: Not on file   Years of education: Not on file   Highest education level: Not on file  Occupational History   Not on file  Tobacco Use   Smoking status: Former    Types: Cigarettes    Quit date: 33    Years since quitting: 50.5   Smokeless tobacco: Not on file  Substance and Sexual Activity   Alcohol use: Not Currently   Drug use: Not Currently   Sexual activity: Not on file  Other Topics Concern   Not on file  Social History Narrative   Not on file   Social Determinants of Health   Financial Resource Strain: Not on file  Food Insecurity: Not on file  Transportation Needs: Not on file  Physical Activity: Not on file  Stress: Not on file  Social Connections: Not on file  Intimate Partner Violence: Not on file     ALLERGIES:    Allergies  Allergen Reactions   No Known Allergies     CURRENT MEDICATIONS:    Current Outpatient Medications  Medication Sig Dispense Refill   bimatoprost (LUMIGAN) 0.01 % SOLN INSTILL ONE DROP IN EACH EYE AT BEDTIME     colesevelam (WELCHOL) 625 MG tablet TAKE TWO TABLETS TWICE DAILY     lisinopril (ZESTRIL) 20 MG tablet Take 20 mg by mouth daily.     moxifloxacin (VIGAMOX) 0.5 % ophthalmic solution Apply to eye.     prednisoLONE acetate (PRED FORTE) 1 % ophthalmic suspension INSTILL ONE DROP IN THE LEFT EYE FOUR TIMES DAILY     rosuvastatin (CRESTOR) 20 MG tablet TAKE ONE TABLET EACH DAY     timolol (TIMOPTIC-XR) 0.5 % ophthalmic gel-forming      No current facility-administered medications for this visit.    REVIEW OF SYSTEMS:   [X]  denotes positive finding, [ ]  denotes negative finding Cardiac  Comments:  Chest pain or chest pressure:    Shortness of breath upon exertion:    Short of breath when lying flat:    Irregular heart rhythm:        Vascular    Pain in calf, thigh, or hip brought on by ambulation:    Pain in feet at night that wakes  you up from your sleep:     Blood clot in your veins:    Leg swelling:         Pulmonary    Oxygen at home:    Productive cough:     Wheezing:         Neurologic    Sudden weakness in arms or legs:     Sudden numbness in arms or legs:     Sudden onset of difficulty speaking or slurred speech:    Temporary loss of vision in one eye:     Problems with dizziness:         Gastrointestinal    Blood in stool:      Vomited blood:         Genitourinary    Burning when urinating:     Blood in urine:        Psychiatric    Major depression:         Hematologic    Bleeding problems:    Problems with blood clotting too easily:        Skin    Rashes or ulcers:        Constitutional    Fever or chills:     PHYSICAL EXAM:   Vitals:   11/01/21 0832  BP: (!) 147/74  Pulse: 60  Resp: 20  Temp:  97.8 F (36.6 C)  TempSrc: Oral  SpO2: 96%  Weight: 231 lb 8 oz (105 kg)  Height: 6' (1.829 m)    GENERAL: The patient is a well-nourished male, in no acute distress. The vital signs are documented above. CARDIAC: There is a regular rate and rhythm.  VASCULAR: Palpable pedal pulses PULMONARY: Nonlabored respirations ABDOMEN: Soft and non-tender  MUSCULOSKELETAL: There are no major deformities or cyanosis. NEUROLOGIC: No focal weakness or paresthesias are detected. SKIN: There are no ulcers or rashes noted. PSYCHIATRIC: The patient has a normal affect.  STUDIES:   I have reviewed the following ultrasound: Abdominal Aorta: There is evidence of abnormal dilatation of the mid and  mid/distal Abdominal aorta. Previous diameter measurement was 2.9 cm  obtained on 01/07/18 at [facility].   ASSESSMENT and PLAN   AAA: Maximum diameter is 3.26 cm on today's study.  I discussed the indications for repair would be greater than 5 cm.  We will continue to monitor this.  I will have him back in 1 year for repeat ultrasound.   Charlena Cross, MD, FACS [facility] of [facility] [phone] Pager [phone]

## 2022-02-04 ENCOUNTER — Other Ambulatory Visit (HOSPITAL_BASED_OUTPATIENT_CLINIC_OR_DEPARTMENT_OTHER): Payer: Self-pay

## 2022-02-04 MED ORDER — FLUAD QUADRIVALENT 0.5 ML IM PRSY
PREFILLED_SYRINGE | INTRAMUSCULAR | 0 refills | Status: AC
  Filled 2022-02-04: qty 0.5, 1d supply, fill #0

## 2022-11-24 ENCOUNTER — Other Ambulatory Visit (HOSPITAL_COMMUNITY): Payer: Medicare Other

## 2022-11-24 ENCOUNTER — Ambulatory Visit: Payer: Medicare Other | Admitting: Surgery

## 2022-12-01 ENCOUNTER — Other Ambulatory Visit: Payer: Self-pay | Admitting: *Deleted

## 2022-12-01 DIAGNOSIS — I714 Abdominal aortic aneurysm, without rupture, unspecified: Secondary | ICD-10-CM

## 2022-12-22 ENCOUNTER — Ambulatory Visit (HOSPITAL_COMMUNITY): Payer: Medicare Other

## 2022-12-22 ENCOUNTER — Ambulatory Visit: Payer: Medicare Other | Admitting: Surgery

## 2023-02-09 ENCOUNTER — Ambulatory Visit: Payer: Medicare Other | Admitting: Surgery

## 2023-02-09 ENCOUNTER — Other Ambulatory Visit (HOSPITAL_COMMUNITY): Payer: Medicare Other

## 2023-03-23 ENCOUNTER — Ambulatory Visit: Payer: Medicare Other | Admitting: Surgery

## 2023-03-23 ENCOUNTER — Other Ambulatory Visit (HOSPITAL_COMMUNITY): Payer: Medicare Other

## 2023-06-01 ENCOUNTER — Ambulatory Visit (HOSPITAL_COMMUNITY)
Admission: RE | Admit: 2023-06-01 | Discharge: 2023-06-01 | Disposition: A | Discharge status: Home / Self Care | Payer: Medicare Other | Source: Ambulatory Visit | Attending: Surgery | Admitting: Surgery

## 2023-06-01 ENCOUNTER — Ambulatory Visit: Payer: Medicare Other | Admitting: Surgery

## 2023-06-01 ENCOUNTER — Encounter: Payer: Self-pay | Admitting: Surgery

## 2023-06-01 VITALS — BP 156/78 | HR 60 | Temp 98.0°F | Resp 20 | Ht 72.0 in | Wt 233.0 lb

## 2023-06-01 DIAGNOSIS — I7143 Infrarenal abdominal aortic aneurysm, without rupture: Secondary | ICD-10-CM

## 2023-06-01 DIAGNOSIS — I714 Abdominal aortic aneurysm, without rupture, unspecified: Secondary | ICD-10-CM | POA: Diagnosis present

## 2023-06-01 NOTE — Progress Notes (Signed)
Vascular and Vein Specialist of [facility]  Patient name: Louis Cruz MRN: [id number] DOB: 03-11-1934 Sex: male   REASON FOR VISIT:    Follow up  HISOTRY OF PRESENT ILLNESS:    Louis Cruz is a 89 y.o. male who was found to have a 2.9 cm infrarenal abdominal aortic aneurysm on screening ultrasound in 2019.  He had a CT scan for back pain at orthopedics which showed a 3 cm aneurysm.  There is a family history of aneurysmal disease in his sister.  He is back today for follow-up.  He does complain of pain along his inner chest which is brought about by stressful situations.  He has lost a few pounds and is getting ready to start a exercise program out at [facility]  Patient is medically managed for hypertension with an ACE inhibitor. He is on a statin for hypercholesterolemia. He has a remote history of smoking  PAST MEDICAL HISTORY:   History reviewed. No pertinent past medical history.   FAMILY HISTORY:   History reviewed. No pertinent family history.  SOCIAL HISTORY:   Social History   Tobacco Use   Smoking status: Former    Current packs/day: 0.00    Types: Cigarettes    Quit date: 1973    Years since quitting: 52.1   Smokeless tobacco: Never  Substance Use Topics   Alcohol use: Not Currently     ALLERGIES:   Allergies  Allergen Reactions   No Known Allergies      CURRENT MEDICATIONS:   Current Outpatient Medications  Medication Sig Dispense Refill   bimatoprost (LUMIGAN) 0.01 % SOLN INSTILL ONE DROP IN EACH EYE AT BEDTIME     colesevelam (WELCHOL) 625 MG tablet TAKE TWO TABLETS TWICE DAILY     influenza vaccine adjuvanted (FLUAD QUADRIVALENT) 0.5 ML injection Inject into the muscle. 0.5 mL 0   lisinopril (ZESTRIL) 20 MG tablet Take 20 mg by mouth daily.     moxifloxacin (VIGAMOX) 0.5 % ophthalmic solution Apply to eye.     prednisoLONE acetate (PRED FORTE) 1 % ophthalmic suspension INSTILL ONE DROP IN THE LEFT  EYE FOUR TIMES DAILY     rosuvastatin (CRESTOR) 20 MG tablet TAKE ONE TABLET EACH DAY     timolol (TIMOPTIC-XR) 0.5 % ophthalmic gel-forming      No current facility-administered medications for this visit.    REVIEW OF SYSTEMS:   [X]  denotes positive finding, [ ]  denotes negative finding Cardiac  Comments:  Chest pain or chest pressure: x   Shortness of breath upon exertion:    Short of breath when lying flat:    Irregular heart rhythm:        Vascular    Pain in calf, thigh, or hip brought on by ambulation:    Pain in feet at night that wakes you up from your sleep:     Blood clot in your veins:    Leg swelling:         Pulmonary    Oxygen at home:    Productive cough:     Wheezing:         Neurologic    Sudden weakness in arms or legs:     Sudden numbness in arms or legs:     Sudden onset of difficulty speaking or slurred speech:    Temporary loss of vision in one eye:     Problems with dizziness:         Gastrointestinal    Blood in  stool:     Vomited blood:         Genitourinary    Burning when urinating:     Blood in urine:        Psychiatric    Major depression:         Hematologic    Bleeding problems:    Problems with blood clotting too easily:        Skin    Rashes or ulcers:        Constitutional    Fever or chills:      PHYSICAL EXAM:   Vitals:   06/01/23 0844  BP: (!) 156/78  Pulse: 60  Resp: 20  Temp: 98 F (36.7 C)  SpO2: 94%  Weight: 233 lb (105.7 kg)  Height: 6' (1.829 m)    GENERAL: The patient is a well-nourished male, in no acute distress. The vital signs are documented above. CARDIAC: There is a regular rate and rhythm.  VASCULAR: Palpable pedal pulses.  No carotid bruits PULMONARY: Non-labored respirations ABDOMEN: Soft and non-tender \  MUSCULOSKELETAL: There are no major deformities or cyanosis. NEUROLOGIC: No focal weakness or paresthesias are detected. SKIN: There are no ulcers or rashes noted. PSYCHIATRIC: The  patient has a normal affect.  STUDIES:   I have reviewed the following: +-----------+-------+----------+----------+--------+--------+--------+  Location  AP (cm)Trans (cm)PSV (cm/s)WaveformThrombusComments  +-----------+-------+----------+----------+--------+--------+--------+  Proximal  2.51   2.69      88                                  +-----------+-------+----------+----------+--------+--------+--------+  Mid       3.24   3.20      99                                  +-----------+-------+----------+----------+--------+--------+--------+  Distal    1.68   2.13      66                                  +-----------+-------+----------+----------+--------+--------+--------+  RT CIA Prox1.1    1.2       82                                  +-----------+-------+----------+----------+--------+--------+--------+  LT CIA Prox1.0    1.2       76                                  +-----------+-------+----------+----------+--------+--------+--------+  MEDICAL ISSUES:   AAA: This was a incidental finding on screening exam.  We have followed this for a few years.  Maximum diameter by ultrasound is 3.3 cm.  This has been correlated with a CT scan from orthopedics.  I will plan on surveillance imaging in 1 year.  Chest pain: The patient reports intercostal pain in the second rib space, which is related to stressful situations.  I have reached out to Dr. Ferd Hibbs office for further evaluation and follow-up.  They are going to schedule him for cardiac evaluation this week.    Charlena Cross, MD, FACS [facility]ists of [facility] [phone] Pager [phone]

## 2023-06-17 ENCOUNTER — Other Ambulatory Visit: Payer: Self-pay

## 2023-06-17 DIAGNOSIS — I7143 Infrarenal abdominal aortic aneurysm, without rupture: Secondary | ICD-10-CM

## 2024-01-08 ENCOUNTER — Encounter (HOSPITAL_COMMUNITY): Payer: Self-pay

## 2024-01-08 ENCOUNTER — Other Ambulatory Visit: Payer: Self-pay

## 2024-01-08 ENCOUNTER — Emergency Department (HOSPITAL_COMMUNITY)

## 2024-01-08 ENCOUNTER — Emergency Department (HOSPITAL_COMMUNITY)
Admission: EM | Admit: 2024-01-08 | Discharge: 2024-01-08 | Disposition: A | Discharge status: Home / Self Care | Source: Ambulatory Visit | Attending: Emergency Medicine | Admitting: Emergency Medicine

## 2024-01-08 DIAGNOSIS — Z79899 Other long term (current) drug therapy: Secondary | ICD-10-CM | POA: Diagnosis not present

## 2024-01-08 DIAGNOSIS — Z5329 Procedure and treatment not carried out because of patient's decision for other reasons: Secondary | ICD-10-CM | POA: Diagnosis not present

## 2024-01-08 DIAGNOSIS — J181 Lobar pneumonia, unspecified organism: Secondary | ICD-10-CM | POA: Diagnosis not present

## 2024-01-08 DIAGNOSIS — J189 Pneumonia, unspecified organism: Secondary | ICD-10-CM

## 2024-01-08 DIAGNOSIS — Z7901 Long term (current) use of anticoagulants: Secondary | ICD-10-CM | POA: Diagnosis not present

## 2024-01-08 DIAGNOSIS — I2693 Single subsegmental pulmonary embolism without acute cor pulmonale: Secondary | ICD-10-CM | POA: Diagnosis not present

## 2024-01-08 DIAGNOSIS — R0989 Other specified symptoms and signs involving the circulatory and respiratory systems: Secondary | ICD-10-CM | POA: Insufficient documentation

## 2024-01-08 DIAGNOSIS — R0602 Shortness of breath: Secondary | ICD-10-CM | POA: Diagnosis present

## 2024-01-08 LAB — COMPREHENSIVE METABOLIC PANEL WITH GFR
ALT: 26 U/L (ref 0–44)
AST: 37 U/L (ref 15–41)
Albumin: 3.1 g/dL — ABNORMAL LOW (ref 3.5–5.0)
Alkaline Phosphatase: 62 U/L (ref 38–126)
Anion gap: 10 (ref 5–15)
BUN: 13 mg/dL (ref 8–23)
CO2: 23 mmol/L (ref 22–32)
Calcium: 8.5 mg/dL — ABNORMAL LOW (ref 8.9–10.3)
Chloride: 97 mmol/L — ABNORMAL LOW (ref 98–111)
Creatinine, Ser: 0.88 mg/dL (ref 0.61–1.24)
GFR, Estimated: >60 mL/min (ref >60)
Glucose, Bld: 115 mg/dL — ABNORMAL HIGH (ref 70–99)
Potassium: 4.5 mmol/L (ref 3.5–5.1)
Sodium: 130 mmol/L — ABNORMAL LOW (ref 135–145)
Total Bilirubin: 1.1 mg/dL (ref 0.0–1.2)
Total Protein: 6.5 g/dL (ref 6.5–8.1)

## 2024-01-08 LAB — I-STAT CHEM 8, ED
BUN: 13 mg/dL (ref 8–23)
Calcium, Ion: 1.04 mmol/L — ABNORMAL LOW (ref 1.15–1.40)
Chloride: 100 mmol/L (ref 98–111)
Creatinine, Ser: 0.8 mg/dL (ref 0.61–1.24)
Glucose, Bld: 117 mg/dL — ABNORMAL HIGH (ref 70–99)
HCT: 31 % — ABNORMAL LOW (ref 39.0–52.0)
Hemoglobin: 10.5 g/dL — ABNORMAL LOW (ref 13.0–17.0)
Potassium: 4.7 mmol/L (ref 3.5–5.1)
Sodium: 130 mmol/L — ABNORMAL LOW (ref 135–145)
TCO2: 22 mmol/L (ref 22–32)

## 2024-01-08 LAB — TROPONIN I (HIGH SENSITIVITY)
Troponin I (High Sensitivity): 83 ng/L — ABNORMAL HIGH (ref <18)
Troponin I (High Sensitivity): 82 ng/L — ABNORMAL HIGH (ref <18)

## 2024-01-08 LAB — CBC
HCT: 31.4 % — ABNORMAL LOW (ref 39.0–52.0)
Hemoglobin: 10.6 g/dL — ABNORMAL LOW (ref 13.0–17.0)
MCH: 37.3 pg — ABNORMAL HIGH (ref 26.0–34.0)
MCHC: 33.8 g/dL (ref 30.0–36.0)
MCV: 110.6 fL — ABNORMAL HIGH (ref 80.0–100.0)
Platelets: 248 K/uL (ref 150–400)
RBC: 2.84 MIL/uL — ABNORMAL LOW (ref 4.22–5.81)
RDW: 13.5 % (ref 11.5–15.5)
WBC: 8.5 K/uL (ref 4.0–10.5)
nRBC: 0 % (ref 0.0–0.2)

## 2024-01-08 LAB — BRAIN NATRIURETIC PEPTIDE: B Natriuretic Peptide: 93.9 pg/mL (ref 0.0–100.0)

## 2024-01-08 MED ORDER — DOXYCYCLINE HYCLATE 100 MG PO TABS
100.0000 mg | ORAL_TABLET | Freq: Once | ORAL | Status: AC
  Administered 2024-01-08: 100 mg via ORAL
  Filled 2024-01-08: qty 1

## 2024-01-08 MED ORDER — IOHEXOL 350 MG/ML SOLN
75.0000 mL | Freq: Once | INTRAVENOUS | Status: AC | PRN
  Administered 2024-01-08: 75 mL via INTRAVENOUS

## 2024-01-08 MED ORDER — DOXYCYCLINE HYCLATE 100 MG PO CAPS: 100.0000 mg | ORAL_CAPSULE | Freq: Two times a day (BID) | ORAL | 0 refills | Status: AC

## 2024-01-08 MED ORDER — APIXABAN (ELIQUIS) VTE STARTER PACK (10MG AND 5MG)
ORAL_TABLET | ORAL | 0 refills | Status: DC
Start: 2024-01-08 — End: 2024-02-04

## 2024-01-08 MED ORDER — APIXABAN 5 MG PO TABS: 10.0000 mg | ORAL_TABLET | Freq: Two times a day (BID) | ORAL | Status: DC

## 2024-01-08 MED ORDER — APIXABAN 5 MG PO TABS
10.0000 mg | ORAL_TABLET | Freq: Once | ORAL | Status: AC
  Administered 2024-01-08: 10 mg via ORAL
  Filled 2024-01-08: qty 2

## 2024-01-08 NOTE — Discharge Instructions (Addendum)
 You were seen today for pulmonary embolism of the left upper lobe as well as pneumonia of the right lower lobe.  You are leaving AGAINST MEDICAL ADVICE at this time however provide you your first dose of blood thinning medication as well as antibiotics today.  I have sent the rest of these prescriptions to your pharmacy.  You will take 10 mg of Eliquis  twice a day for the next 7 days, then switching to 5 mg twice a day.  You will also be taking antibiotic, take this twice a day for the next 5 days.  You were given first dose of this medication.  If you still have persistent symptoms or if any symptoms get worse, return to the ED immediately as again you are leaving AGAINST MEDICAL ADVICE at this time.  If you have progressively bettering symptoms, and do not wish to return to the ED, continue to follow-up with your primary care  You can also look at getting an Eliquis  co-pay if you go online and look up a coupon for this, they can give your first month for free

## 2024-01-08 NOTE — ED Triage Notes (Signed)
 C/o sob onset 2 weeks ago , states he isn't getting better and was sent to the ED By his PCP for further testing, Denies chest pain

## 2024-01-08 NOTE — ED Notes (Signed)
 C.Hannan, Nurse manager provided pt with cab voucher

## 2024-01-08 NOTE — ED Provider Notes (Signed)
 Louis Cruz Provider Note   CSN: [id number] Arrival date & time: 01/08/24  1356     Patient presents with: Shortness of Breath   Louis Cruz is a 89 y.o. male. The history is provided by the patient.  Shortness of Breath Associated symptoms: chest pain   Patient is an 89 year old male presented ED today for concerns for shortness of breath x 2 weeks, noting accompanying cough.  States that he saw his PCP today, and was told to come to the ED for further evaluation.  Previous medical history of HLD, AAA, spinal stenosis. Notes that he has had some mild left-sided chest pain particularly on exertion, noting that it does feel like pressure.  Denies fever, headache, vision changes, diplopia, sore throat, congestion, orthopnea, nausea, vomiting, diarrhea, hematochezia, melena, dysuria, lower leg swelling.    Prior to Admission medications   Medication Sig Start Date End Date Taking? Authorizing Provider  APIXABAN  (ELIQUIS ) VTE STARTER PACK (10MG  AND 5MG ) Take as directed on package: start with two-5mg  tablets twice daily for 7 days. On day 8, switch to one-5mg  tablet twice daily. 01/08/24  Yes Beola Louis RAMAN, Cruz-C  doxycycline  (VIBRAMYCIN ) 100 MG capsule Take 1 capsule (100 mg total) by mouth 2 (two) times daily for 5 days. 01/08/24 01/13/24 Yes Corrine Tillis S, Cruz-C  bimatoprost (LUMIGAN) 0.01 % SOLN INSTILL ONE DROP IN EACH EYE AT BEDTIME    [provider]  colesevelam (WELCHOL) 625 MG tablet TAKE TWO TABLETS TWICE DAILY    [provider]  influenza vaccine adjuvanted (FLUAD  QUADRIVALENT) 0.5 ML injection Inject into the muscle. 02/03/22   Luiz Channel, MD  lisinopril (ZESTRIL) 20 MG tablet Take 20 mg by mouth daily. 10/03/21   [provider]  moxifloxacin (VIGAMOX) 0.5 % ophthalmic solution Apply to eye. 10/03/21   [provider]  prednisoLONE acetate (PRED FORTE) 1 % ophthalmic suspension INSTILL ONE  DROP IN THE LEFT EYE FOUR TIMES DAILY    [provider]  rosuvastatin (CRESTOR) 20 MG tablet TAKE ONE TABLET EACH DAY    [provider]  timolol (TIMOPTIC-XR) 0.5 % ophthalmic gel-forming     [provider]    Allergies: No known allergies    Review of Systems  Respiratory:  Positive for shortness of breath.   Cardiovascular:  Positive for chest pain.  All other systems reviewed and are negative.   Updated Vital Signs BP (!) 158/71   Pulse 87   Temp 99.2 F (37.3 C)   Resp 15   Ht 6' (1.829 m)   Wt 98.9 kg   SpO2 93%   BMI 29.57 kg/m   Physical Exam Vitals and nursing note reviewed.  Constitutional:      General: He is not in acute distress.    Appearance: Normal appearance. He is not ill-appearing or diaphoretic.  HENT:     Head: Normocephalic and atraumatic.     Mouth/Throat:     Mouth: Mucous membranes are moist.     Pharynx: Oropharynx is clear. No pharyngeal swelling or oropharyngeal exudate.  Eyes:     General: No scleral icterus.       Right eye: No discharge.        Left eye: No discharge.     Extraocular Movements: Extraocular movements intact.     Conjunctiva/sclera: Conjunctivae normal.  Neck:     Vascular: No JVD.     Trachea: No tracheal deviation.  Cardiovascular:  Rate and Rhythm: Normal rate and regular rhythm.     Pulses: Normal pulses.     Heart sounds: Normal heart sounds. No murmur heard.    No friction rub. No gallop.  Pulmonary:     Effort: Pulmonary effort is normal. No respiratory distress.     Breath sounds: No stridor. Examination of the right-lower field reveals rales. Rales present. No wheezing or rhonchi.  Chest:     Chest wall: No tenderness, crepitus or edema.  Abdominal:     General: Abdomen is flat. There is no distension.     Palpations: Abdomen is soft.     Tenderness: There is no abdominal tenderness. There is no right CVA tenderness, left CVA tenderness, guarding or rebound.   Musculoskeletal:        General: No swelling, deformity or signs of injury.     Cervical back: Normal range of motion. No rigidity.     Right lower leg: No tenderness. No edema.     Left lower leg: No tenderness. No edema.     Comments: Negative Homans' sign bilaterally  Skin:    General: Skin is warm and dry.     Findings: No bruising, erythema or lesion.  Neurological:     General: No focal deficit present.     Mental Status: He is alert and oriented to person, place, and time. Mental status is at baseline.     Sensory: No sensory deficit.     Motor: No weakness.  Psychiatric:        Mood and Affect: Mood normal.     (all labs ordered are listed, but only abnormal results are displayed) Labs Reviewed  CBC - Abnormal; Notable for the following components:      Result Value   RBC 2.84 (*)    Hemoglobin 10.6 (*)    HCT 31.4 (*)    MCV 110.6 (*)    MCH 37.3 (*)    All other components within normal limits  COMPREHENSIVE METABOLIC PANEL WITH GFR - Abnormal; Notable for the following components:   Sodium 130 (*)    Chloride 97 (*)    Glucose, Bld 115 (*)    Calcium 8.5 (*)    Albumin 3.1 (*)    All other components within normal limits  I-STAT CHEM 8, ED - Abnormal; Notable for the following components:   Sodium 130 (*)    Glucose, Bld 117 (*)    Calcium, Ion 1.04 (*)    Hemoglobin 10.5 (*)    HCT 31.0 (*)    All other components within normal limits  TROPONIN I (HIGH SENSITIVITY) - Abnormal; Notable for the following components:   Troponin I (High Sensitivity) 83 (*)    All other components within normal limits  TROPONIN I (HIGH SENSITIVITY) - Abnormal; Notable for the following components:   Troponin I (High Sensitivity) 82 (*)    All other components within normal limits  BRAIN NATRIURETIC PEPTIDE    EKG: EKG Interpretation Date/Time:  Friday January 08 2024 14:09:42 EDT Ventricular Rate:  97 PR Interval:  262 QRS Duration:  102 QT Interval:  344 QTC  Calculation: 436 R Axis:   -31  Text Interpretation: Sinus rhythm with 1st degree A-V block Left axis deviation Nonspecific ST abnormality Confirmed by Francesca Fallow ([id number]) on 01/08/2024 2:44:44 PM  Radiology: CT Angio Chest PE W and/or Wo Contrast Result Date: 01/08/2024 CLINICAL DATA:  High probability for PE, shortness of breath EXAM: CT ANGIOGRAPHY CHEST WITH CONTRAST  TECHNIQUE: Multidetector CT imaging of the chest was performed using the standard protocol during bolus administration of intravenous contrast. Multiplanar CT image reconstructions and MIPs were obtained to evaluate the vascular anatomy. RADIATION DOSE REDUCTION: This exam was performed according to the departmental dose-optimization program which includes automated exposure control, adjustment of the mA and/or kV according to patient size and/or use of iterative reconstruction technique. CONTRAST:  75mL OMNIPAQUE  IOHEXOL  350 MG/ML SOLN COMPARISON:  CT abdomen pelvis November 12, 2021, chest radiograph January 08, 2024. FINDINGS: Cardiovascular: Satisfactory opacification of the pulmonary arteries to the segmental level. There is a segmental eccentric nonobstructive pulmonary embolus involving the lingular branch, possibly subacute. (5/87, 8/78) Pulmonary artery is borderline normal measuring 2.9 cm. Normal heart size. No pericardial effusion. Atherosclerotic calcifications of coronary arteries. Mediastinum/Nodes: Few subcentimeter mediastinal lymph nodes. Lungs/Pleura: Patchy areas of ground-glass attenuation, interlobular septal thickening (crazy paving appearance) involving the right upper middle and lower lobes with associated bronchial and bronchiolar wall thickening consistent with multifocal infection/inflammation. Few small areas of areas ill-defined ground-glass attenuation identified in subpleural left upper lobe (7/61) and left lower lobe (7/80). No pleural effusion. Upper Abdomen: Few hypodense liver lesions measuring up to 1.7 cm  similar to prior likely representing cysts. Musculoskeletal: No suspicious osseous lesion. Review of the MIP images confirms the above findings. IMPRESSION: Eccentric nonobstructive pulmonary artery embolus involving the lingular segmental branch, likely subacute. Diffuse multifocal ground-glass attenuation (crazy paving) predominantly involving the right lung most consistent with infectious/inflammatory process. Recommend follow-up to ensure complete resolution. Critical Value/emergent results were called by telephone at the time of interpretation by Dr Megan Zare on 01/08/2024 to provider Louis Cruz who verbally acknowledged these results. Electronically Signed   By: Megan  Zare M.D.   On: 01/08/2024 19:56   DG Chest Portable 1 View Result Date: 01/08/2024 CLINICAL DATA:  Cough, shortness of breath. EXAM: PORTABLE CHEST 1 VIEW COMPARISON:  None Available. FINDINGS: Mild cardiomegaly is noted. Left lung is clear. Multiple patchy opacities are noted in right lung concerning for pneumonia. Bony thorax is unremarkable. IMPRESSION: Multiple patchy opacities are noted in right lung concerning for pneumonia. Followup Cruz and lateral chest X-ray is recommended in 3-4 weeks following trial of antibiotic therapy to ensure resolution and exclude underlying malignancy. Electronically Signed   By: Lynwood Landy Raddle M.D.   On: 01/08/2024 15:10    Procedures   Medications Ordered in the ED  doxycycline  (VIBRA -TABS) tablet 100 mg (has no administration in time range)  apixaban  (ELIQUIS ) tablet 10 mg (has no administration in time range)  iohexol  (OMNIPAQUE ) 350 MG/ML injection 75 mL (75 mLs Intravenous Contrast Given 01/08/24 1845)    Clinical Course as of 01/08/24 2019  Fri Jan 08, 2024  1927 Spoke with radiology, Dr. Thelbert who noted segmental pulmonary embolism no heart strain in L upper lobe as well as noting pneumonia in R lower lobe [CB]    Clinical Course User Index [CB] First Louis RAMAN, Cruz-C                                Medical Decision Making Amount and/or Complexity of Data Reviewed Labs: ordered. Decision-making details documented in ED Course. Radiology: ordered.  Risk Prescription drug management.   This patient is a 89 year old male who presents to the ED for concern of left-sided chest pain and new onset shortness of breath, seen by his primary care today and was told come to ED  today for concerns for PE and pneumonia.   On physical exam, patient is in no acute distress, afebrile, alert and orient x 4, speaking in full sentences, nontachypneic, nontachycardic.  Notably has some mild rales noted to right lower lobe.  RRR, no murmur, no lower leg edema.  Abdomen is nontender, no CVA tenderness.  Unremarkable exam otherwise. Chest x-ray confirmed pneumonia right lower lobe. CT imaging did show a segmental PE in the left upper lobe with no heart strain.  With patient's current condition of both pneumonia as well as segmental PE, recommended admission however patient did not wish to be admitted at this time.  Saying that he has affairs to get in order first.  Again told him that he would be at risk of worsening sickness, possible death, or debilitating morbidity.  Patient expressed understanding and continued to wish to go home at this time.  Will provide starter of Eliquis  as well as dose of doxycycline  in the ED.  Will also have him continue to pick up these medications at the pharmacy tomorrow.  Told him that if he has persistent or worsening symptoms to return to the ED however if he persists on not going back to the ED, to have him be evaluated by his PCP as soon as possible.  Patient expressed understanding and wished to go at this time.  Patient vital signs remained stable.  Patient discharged AMA.    Differential diagnoses prior to evaluation: The emergent differential diagnosis includes, but is not limited to,  CHF, pericardial effusion/tamponade, arrhythmias, ACS, COPD, asthma,  bronchitis, pneumonia, pneumothorax, PE, anemia . This is not an exhaustive differential.   Past Medical History / Co-morbidities / Social History: AAA, HLD, spinal stenosis  Additional history: Chart reviewed. Pertinent results include:   Last seen by vascular surgery on 06/01/2023 for AAA, continuing surveillance, was supposed to follow-up for cardiac evaluation.  Lab Tests/Imaging studies: I personally interpreted labs/imaging and the pertinent results include:   CBC notes a anemia with hemoglobin 10.6 CMP notes a hyponatremia 130 as well as hypocalcemia of 8.9 Noted also have an elevated troponin of 83 with a delta of 82  Chest x-ray shows suspicious for pneumonia right lower lobe  CT angio shows nonobstructing PE submental, subacute as well as diffuse multifocal ground glass attenuation to right lung.  I agree with the radiologist interpretation.  Cardiac monitoring: EKG obtained and interpreted by myself and attending physician which shows: Sinus rhythm with a first-degree AV block   Medications: I ordered medication including Eliquis , doxycycline .  I have reviewed the patients home medicines and have made adjustments as needed.  Critical Interventions: None  Social Determinants of Health: Has good follow-up with PCP  Disposition: After consideration of the diagnostic results and the patients response to treatment, I feel that the patient would benefit from admission however patient is wishing to leave AMA, went over extreme lengths to express to patient that he is at risk of debilitating morbidity, death, worsening illness however patient is still persistent on going home at this time.   Final diagnoses:  Single subsegmental pulmonary embolism without acute cor pulmonale (HCC)  Community acquired pneumonia of right lower lobe of lung    ED Discharge Orders          Ordered    APIXABAN  (ELIQUIS ) VTE STARTER PACK (10MG  AND 5MG )       Note to Pharmacy: If starter pack  unavailable, substitute with seventy-four 5 mg apixaban  tabs following the above SIG directions.  01/08/24 2011    doxycycline  (VIBRAMYCIN ) 100 MG capsule  2 times daily        01/08/24 2011               Beola Louis RAMAN, Cruz-C 01/08/24 2020    Elnor Savant A, DO 01/14/24 1558

## 2024-01-08 NOTE — ED Notes (Signed)
Got patient on the monitor into a gown patient is resting with call bell in reach  

## 2024-01-14 ENCOUNTER — Other Ambulatory Visit (HOSPITAL_COMMUNITY): Payer: Self-pay

## 2024-02-03 ENCOUNTER — Other Ambulatory Visit: Payer: Self-pay | Admitting: Internal Medicine

## 2024-02-03 DIAGNOSIS — R7989 Other specified abnormal findings of blood chemistry: Secondary | ICD-10-CM

## 2024-02-04 ENCOUNTER — Ambulatory Visit (INDEPENDENT_AMBULATORY_CARE_PROVIDER_SITE_OTHER)

## 2024-02-04 ENCOUNTER — Ambulatory Visit: Admitting: Pulmonary Disease

## 2024-02-04 ENCOUNTER — Encounter: Payer: Self-pay | Admitting: Pulmonary Disease

## 2024-02-04 VITALS — BP 110/54 | HR 102 | Ht 72.0 in | Wt 204.4 lb

## 2024-02-04 DIAGNOSIS — J8489 Other specified interstitial pulmonary diseases: Secondary | ICD-10-CM

## 2024-02-04 DIAGNOSIS — I2699 Other pulmonary embolism without acute cor pulmonale: Secondary | ICD-10-CM | POA: Diagnosis not present

## 2024-02-04 DIAGNOSIS — J189 Pneumonia, unspecified organism: Secondary | ICD-10-CM | POA: Diagnosis not present

## 2024-02-04 MED ORDER — APIXABAN 5 MG PO TABS: 5.0000 mg | ORAL_TABLET | Freq: Two times a day (BID) | ORAL | 3 refills | Status: AC

## 2024-02-04 MED ORDER — SULFAMETHOXAZOLE-TRIMETHOPRIM 800-160 MG PO TABS: 1.0000 | ORAL_TABLET | ORAL | 1 refills | Status: AC

## 2024-02-04 MED ORDER — PREDNISONE 10 MG PO TABS: ORAL_TABLET | ORAL | 0 refills | Status: AC

## 2024-02-04 NOTE — Progress Notes (Unsigned)
 Subjective:   PATIENT ID: Louis Cruz GENDER: male DOB: 08-12-1933, MRN: [id number]   HPI Discussed the use of AI scribe software for clinical note transcription with the patient, who gave verbal consent to proceed.  History of Present Illness   Louis Cruz is an 89 year old male who presents with persistent shortness of breath following pneumonia. He was referred by Dr. Norleen Jungling for evaluation of persistent breathing difficulties post-pneumonia.  He experienced pneumonia a few weeks ago, treated with course of doxycycline , after and ER visit. He was also noted to have segmental pulmonary emboli of the lingula and stated on eliquis  therapy. He stopped taking eliquis  over the past week or so as he ran out of medication. He continues to have labored breathing and shortness of breath, especially with exertion. Prior to pneumonia, he had no breathing difficulties. He has experienced significant weight loss of approximately 25-30 pounds over the past five to six weeks due to a lack of appetite. He denies fevers, chills, sweats, joint pains, skin rashes, dry eyes, or dry mouth.  No prior history of blood clots.  He has not smoked since age 15 and denies recent international travel. He is a retired Animator and currently doing Secretary/administrator.    Past Medical History:  Diagnosis Date   Hyperlipidemia    Hypertension      History reviewed. No pertinent family history.   Social History   Socioeconomic History   Marital status: Married    Spouse name: Not on file   Number of children: Not on file   Years of education: Not on file   Highest education level: Not on file  Occupational History   Not on file  Tobacco Use   Smoking status: Former    Current packs/day: 0.00    Types: Cigarettes    Quit date: 41    Years since quitting: 52.7   Smokeless tobacco: Never  Substance and Sexual Activity   Alcohol use: Yes    Alcohol/week: 1.0 standard drink of  alcohol    Types: 1 Glasses of wine per week   Drug use: Not Currently   Sexual activity: Not on file  Other Topics Concern   Not on file  Social History Narrative   Not on file   Social Drivers of Health   Financial Resource Strain: Not on file  Food Insecurity: Not on file  Transportation Needs: Not on file  Physical Activity: Not on file  Stress: Not on file  Social Connections: Not on file  Intimate Partner Violence: Not on file     Allergies  Allergen Reactions   No Known Allergies      Outpatient Medications Prior to Visit  Medication Sig Dispense Refill   bimatoprost (LUMIGAN) 0.01 % SOLN INSTILL ONE DROP IN EACH EYE AT BEDTIME     colesevelam (WELCHOL) 625 MG tablet TAKE TWO TABLETS TWICE DAILY     influenza vaccine adjuvanted (FLUAD  QUADRIVALENT) 0.5 ML injection Inject into the muscle. 0.5 mL 0   lisinopril (ZESTRIL) 20 MG tablet Take 20 mg by mouth daily.     moxifloxacin (VIGAMOX) 0.5 % ophthalmic solution Apply to eye.     prednisoLONE acetate (PRED FORTE) 1 % ophthalmic suspension INSTILL ONE DROP IN THE LEFT EYE FOUR TIMES DAILY     rosuvastatin (CRESTOR) 20 MG tablet TAKE ONE TABLET EACH DAY     timolol (TIMOPTIC-XR) 0.5 % ophthalmic gel-forming      APIXABAN  (ELIQUIS )  VTE STARTER PACK (10MG  AND 5MG ) Take as directed on package: start with two-5mg  tablets twice daily for 7 days. On day 8, switch to one-5mg  tablet twice daily. 74 each 0   No facility-administered medications prior to visit.   Review of Systems  Constitutional:  Positive for weight loss. Negative for chills, fever and malaise/fatigue.  HENT:  Negative for congestion, sinus pain and sore throat.   Eyes: Negative.   Respiratory:  Positive for shortness of breath. Negative for cough, hemoptysis, sputum production and wheezing.   Cardiovascular:  Negative for chest pain, palpitations, orthopnea, claudication and leg swelling.  Gastrointestinal:  Negative for abdominal pain, heartburn, nausea and  vomiting.  Genitourinary: Negative.   Musculoskeletal:  Negative for joint pain and myalgias.  Skin:  Negative for rash.  Neurological:  Negative for weakness.  Endo/Heme/Allergies: Negative.   Psychiatric/Behavioral: Negative.      Objective:   Vitals:   02/04/24 1446  BP: (!) 110/54  Pulse: (!) 102  SpO2: 90%  Weight: 204 lb 6.4 oz (92.7 kg)  Height: 6' (1.829 m)   Physical Exam Constitutional:      General: He is not in acute distress.    Appearance: Normal appearance.  Eyes:     General: No scleral icterus.    Conjunctiva/sclera: Conjunctivae normal.  Cardiovascular:     Rate and Rhythm: Normal rate and regular rhythm.  Pulmonary:     Breath sounds: Rales (on right) present. No wheezing or rhonchi.  Musculoskeletal:     Right lower leg: No edema.     Left lower leg: No edema.  Skin:    General: Skin is warm and dry.  Neurological:     General: No focal deficit present.    CBC    Component Value Date/Time   WBC 8.5 01/08/2024 1553   RBC 2.84 (L) 01/08/2024 1553   HGB 10.5 (L) 01/08/2024 1600   HCT 31.0 (L) 01/08/2024 1600   PLT 248 01/08/2024 1553   MCV 110.6 (H) 01/08/2024 1553   MCH 37.3 (H) 01/08/2024 1553   MCHC 33.8 01/08/2024 1553   RDW 13.5 01/08/2024 1553      Latest Ref Rng & Units 01/08/2024    4:00 PM 01/08/2024    3:53 PM  BMP  Glucose 70 - 99 mg/dL 882  884   BUN 8 - 23 mg/dL 13  13   Creatinine 9.38 - 1.24 mg/dL 9.19  9.11   Sodium 864 - 145 mmol/L 130  130   Potassium 3.5 - 5.1 mmol/L 4.7  4.5   Chloride 98 - 111 mmol/L 100  97   CO2 22 - 32 mmol/L  23   Calcium 8.9 - 10.3 mg/dL  8.5    Chest imaging: CT Chest 01/08/24 Cardiovascular: Satisfactory opacification of the pulmonary arteries to the segmental level. There is a segmental eccentric nonobstructive pulmonary embolus involving the lingular branch, possibly subacute. (5/87, 8/78)   Pulmonary artery is borderline normal measuring 2.9 cm. Normal heart size. No pericardial  effusion. Atherosclerotic calcifications of coronary arteries.   Mediastinum/Nodes: Few subcentimeter mediastinal lymph nodes.   Lungs/Pleura: Patchy areas of ground-glass attenuation, interlobular septal thickening (crazy paving appearance) involving the right upper middle and lower lobes with associated bronchial and bronchiolar wall thickening consistent with multifocal infection/inflammation.   Few small areas of areas ill-defined ground-glass attenuation identified in subpleural left upper lobe (7/61) and left lower lobe (7/80). No pleural effusion.  CXR 02/05/24 Increased right lung infiltrates and opacities. Does not  appear to have right effusion. Left lung appears clear.  PFT:     No data to display          Labs:  Path:  Echo:  Heart Catheterization:       Assessment & Plan:   Other acute pulmonary embolism, unspecified whether acute cor pulmonale present (HCC) - Plan: apixaban  (ELIQUIS ) 5 MG TABS tablet  Pneumonia of right upper lobe due to infectious organism - Plan: DG Chest 2 View, CBC with Differential/Platelet, Comp Met (CMET), C-reactive protein, Sed Rate (ESR)  Organizing pneumonia (HCC) - Plan: predniSONE (DELTASONE) 10 MG tablet, sulfamethoxazole-trimethoprim (BACTRIM DS) 800-160 MG tablet, ANCA screen with reflex titer, ANA, Anti-Smith antibody, Rheumatoid factor, Hypersensitivity Pneumonitis Assessment and Plan     Pneumonia, Right Lung Persistent infiltrates of right lung with increasing density of opacities. Differential includes organizing pneumonia vs new onset ILD vs aspiration pneumonia vs malignancy. - does not appear to have on going infectious etiology - check labs today: CBC, BMP, CRP, ESR, ANA, ANCA, RF, HSP Panel - start high dose prednisone taper - 40mg  daily for 10 days, 30mg  daily for 10 days and 20mg  daily for 10 days - start bactrim DS 1 tab 3 days per week in 2 weeks - will arrange close follow up in 2 weeks  Unintentional  weight loss Unintentional weight loss of 25-30 pounds over 5-6 weeks. No appetite. - Encourage increased nutritional intake.     Follow up in 2 weeks  65 minutes spent on this visit.  Dorn Chill, MD [facility] Office: [phone]   Current Outpatient Medications:    apixaban  (ELIQUIS ) 5 MG TABS tablet, Take 1 tablet (5 mg total) by mouth 2 (two) times daily., Disp: 60 tablet, Rfl: 3   bimatoprost (LUMIGAN) 0.01 % SOLN, INSTILL ONE DROP IN EACH EYE AT BEDTIME, Disp: , Rfl:    colesevelam (WELCHOL) 625 MG tablet, TAKE TWO TABLETS TWICE DAILY, Disp: , Rfl:    influenza vaccine adjuvanted (FLUAD  QUADRIVALENT) 0.5 ML injection, Inject into the muscle., Disp: 0.5 mL, Rfl: 0   lisinopril (ZESTRIL) 20 MG tablet, Take 20 mg by mouth daily., Disp: , Rfl:    moxifloxacin (VIGAMOX) 0.5 % ophthalmic solution, Apply to eye., Disp: , Rfl:    prednisoLONE acetate (PRED FORTE) 1 % ophthalmic suspension, INSTILL ONE DROP IN THE LEFT EYE FOUR TIMES DAILY, Disp: , Rfl:    predniSONE (DELTASONE) 10 MG tablet, Take 4 tablets (40 mg total) by mouth daily with breakfast for 10 days, THEN 3 tablets (30 mg total) daily with breakfast for 10 days, THEN 2 tablets (20 mg total) daily with breakfast for 10 days., Disp: 90 tablet, Rfl: 0   rosuvastatin (CRESTOR) 20 MG tablet, TAKE ONE TABLET EACH DAY, Disp: , Rfl:    sulfamethoxazole-trimethoprim (BACTRIM DS) 800-160 MG tablet, Take 1 tablet by mouth 3 (three) times a week., Disp: 12 tablet, Rfl: 1   timolol (TIMOPTIC-XR) 0.5 % ophthalmic gel-forming, , Disp: , Rfl:

## 2024-02-04 NOTE — Patient Instructions (Addendum)
 I am concerned you have organizing pneumonia, which is an inflammatory pneumonia  Start prednisone taper 40mg  daily for 10 days 30mg  daily for 10 days 20mg  daily for 10 days  Start taking bactrim DS 1 tab daily, 3 days per week (Mon, Wed, Fri) in 2 weeks  Follow up in 2 weeks

## 2024-02-05 ENCOUNTER — Encounter: Payer: Self-pay | Admitting: Pulmonary Disease

## 2024-02-05 ENCOUNTER — Telehealth: Payer: Self-pay

## 2024-02-05 NOTE — Telephone Encounter (Signed)
-----   Message from Dorn KATHEE Chill sent at 02/05/2024  7:16 AM EDT ----- Regarding: Labs Did patient not get labs done yesterday?  Louis Cruz

## 2024-02-05 NOTE — Telephone Encounter (Signed)
 Spoke with patient scheduled lab appointment 02/08/24 @ 11:10 am

## 2024-02-08 ENCOUNTER — Other Ambulatory Visit (INDEPENDENT_AMBULATORY_CARE_PROVIDER_SITE_OTHER)

## 2024-02-08 DIAGNOSIS — J8489 Other specified interstitial pulmonary diseases: Secondary | ICD-10-CM | POA: Diagnosis not present

## 2024-02-08 DIAGNOSIS — J189 Pneumonia, unspecified organism: Secondary | ICD-10-CM

## 2024-02-08 LAB — CBC WITH DIFFERENTIAL/PLATELET
Basophils Absolute: 0.1 K/uL (ref 0.0–0.1)
Basophils Relative: 0.5 % (ref 0.0–3.0)
Eosinophils Absolute: 0.1 K/uL (ref 0.0–0.7)
Eosinophils Relative: 0.5 % (ref 0.0–5.0)
HCT: 27 % — ABNORMAL LOW (ref 39.0–52.0)
Hemoglobin: 9 g/dL — ABNORMAL LOW (ref 13.0–17.0)
Lymphocytes Relative: 14.5 % (ref 12.0–46.0)
Lymphs Abs: 1.7 K/uL (ref 0.7–4.0)
MCHC: 33.3 g/dL (ref 30.0–36.0)
MCV: 106.6 fl — ABNORMAL HIGH (ref 78.0–100.0)
Monocytes Absolute: 0.8 K/uL (ref 0.1–1.0)
Monocytes Relative: 6.3 % (ref 3.0–12.0)
Neutro Abs: 9.4 K/uL — ABNORMAL HIGH (ref 1.4–7.7)
Neutrophils Relative %: 78.2 % — ABNORMAL HIGH (ref 43.0–77.0)
Platelets: 471 K/uL — ABNORMAL HIGH (ref 150.0–400.0)
RBC: 2.54 Mil/uL — ABNORMAL LOW (ref 4.22–5.81)
RDW: 13.2 % (ref 11.5–15.5)
WBC: 12 K/uL — ABNORMAL HIGH (ref 4.0–10.5)

## 2024-02-08 LAB — SEDIMENTATION RATE: Sed Rate: 74 mm/h — ABNORMAL HIGH (ref 0–20)

## 2024-02-09 LAB — COMPREHENSIVE METABOLIC PANEL WITH GFR
ALT: 65 U/L — ABNORMAL HIGH (ref 0–53)
AST: 44 U/L — ABNORMAL HIGH (ref 0–37)
Albumin: 3.1 g/dL — ABNORMAL LOW (ref 3.5–5.2)
Alkaline Phosphatase: 75 U/L (ref 39–117)
BUN: 20 mg/dL (ref 6–23)
CO2: 20 meq/L (ref 19–32)
Calcium: 9 mg/dL (ref 8.4–10.5)
Chloride: 103 meq/L (ref 96–112)
Creatinine, Ser: 0.66 mg/dL (ref 0.40–1.50)
GFR: 83.94 mL/min (ref >60.00)
Glucose, Bld: 116 mg/dL — ABNORMAL HIGH (ref 70–99)
Potassium: 3.4 meq/L — ABNORMAL LOW (ref 3.5–5.1)
Sodium: 137 meq/L (ref 135–145)
Total Bilirubin: 0.4 mg/dL (ref 0.2–1.2)
Total Protein: 6.9 g/dL (ref 6.0–8.3)

## 2024-02-09 LAB — C-REACTIVE PROTEIN: CRP: 5.6 mg/dL (ref 0.5–20.0)

## 2024-02-11 ENCOUNTER — Ambulatory Visit
Admission: RE | Admit: 2024-02-11 | Discharge: 2024-02-11 | Disposition: A | Discharge status: Home / Self Care | Source: Ambulatory Visit | Attending: Internal Medicine | Admitting: Internal Medicine

## 2024-02-11 DIAGNOSIS — R7989 Other specified abnormal findings of blood chemistry: Secondary | ICD-10-CM

## 2024-02-12 LAB — HYPERSENSITIVITY PNEUMONITIS
A. Pullulans Abs: NEGATIVE
A.Fumigatus #1 Abs: NEGATIVE
Micropolyspora faeni, IgG: NEGATIVE
Pigeon Serum Abs: NEGATIVE
Thermoact. Saccharii: NEGATIVE
Thermoactinomyces vulgaris, IgG: NEGATIVE

## 2024-02-13 LAB — RHEUMATOID FACTOR: Rheumatoid fact SerPl-aCnc: <10 [IU]/mL (ref <14)

## 2024-02-13 LAB — ANA: Anti Nuclear Antibody (ANA): NEGATIVE

## 2024-02-13 LAB — ANCA SCREEN W REFLEX TITER: ANCA SCREEN: NEGATIVE

## 2024-02-13 LAB — ANTI-SMITH ANTIBODY: ENA SM Ab Ser-aCnc: <1 AI

## 2024-02-19 ENCOUNTER — Emergency Department (HOSPITAL_BASED_OUTPATIENT_CLINIC_OR_DEPARTMENT_OTHER)
Admission: EM | Admit: 2024-02-19 | Discharge: 2024-02-19 | Disposition: A | Discharge status: Nursing Home (Includes ICF) | Source: Ambulatory Visit | Attending: Emergency Medicine | Admitting: Emergency Medicine

## 2024-02-19 ENCOUNTER — Encounter (HOSPITAL_BASED_OUTPATIENT_CLINIC_OR_DEPARTMENT_OTHER): Payer: Self-pay | Admitting: Emergency Medicine

## 2024-02-19 ENCOUNTER — Emergency Department (HOSPITAL_BASED_OUTPATIENT_CLINIC_OR_DEPARTMENT_OTHER): Admitting: Radiology

## 2024-02-19 ENCOUNTER — Other Ambulatory Visit: Payer: Self-pay

## 2024-02-19 ENCOUNTER — Telehealth: Payer: Self-pay

## 2024-02-19 DIAGNOSIS — W19XXXA Unspecified fall, initial encounter: Secondary | ICD-10-CM

## 2024-02-19 DIAGNOSIS — Z7901 Long term (current) use of anticoagulants: Secondary | ICD-10-CM | POA: Insufficient documentation

## 2024-02-19 DIAGNOSIS — R945 Abnormal results of liver function studies: Secondary | ICD-10-CM | POA: Diagnosis not present

## 2024-02-19 DIAGNOSIS — R634 Abnormal weight loss: Secondary | ICD-10-CM | POA: Diagnosis not present

## 2024-02-19 DIAGNOSIS — W07XXXA Fall from chair, initial encounter: Secondary | ICD-10-CM | POA: Insufficient documentation

## 2024-02-19 DIAGNOSIS — I959 Hypotension, unspecified: Secondary | ICD-10-CM | POA: Insufficient documentation

## 2024-02-19 DIAGNOSIS — R630 Anorexia: Secondary | ICD-10-CM | POA: Diagnosis present

## 2024-02-19 LAB — COMPREHENSIVE METABOLIC PANEL WITH GFR
ALT: 26 U/L (ref 0–44)
AST: 24 U/L (ref 15–41)
Albumin: 3.2 g/dL — ABNORMAL LOW (ref 3.5–5.0)
Alkaline Phosphatase: 81 U/L (ref 38–126)
Anion gap: 12 (ref 5–15)
BUN: 14 mg/dL (ref 8–23)
CO2: 22 mmol/L (ref 22–32)
Calcium: 9 mg/dL (ref 8.9–10.3)
Chloride: 100 mmol/L (ref 98–111)
Creatinine, Ser: 0.63 mg/dL (ref 0.61–1.24)
GFR, Estimated: >60 mL/min (ref >60)
Glucose, Bld: 102 mg/dL — ABNORMAL HIGH (ref 70–99)
Potassium: 4.2 mmol/L (ref 3.5–5.1)
Sodium: 133 mmol/L — ABNORMAL LOW (ref 135–145)
Total Bilirubin: 0.5 mg/dL (ref 0.0–1.2)
Total Protein: 6.6 g/dL (ref 6.5–8.1)

## 2024-02-19 LAB — CBC WITH DIFFERENTIAL/PLATELET
Abs Immature Granulocytes: 0.25 K/uL — ABNORMAL HIGH (ref 0.00–0.07)
Basophils Absolute: 0 K/uL (ref 0.0–0.1)
Basophils Relative: 0 %
Eosinophils Absolute: 0.3 K/uL (ref 0.0–0.5)
Eosinophils Relative: 3 %
HCT: 27 % — ABNORMAL LOW (ref 39.0–52.0)
Hemoglobin: 9.1 g/dL — ABNORMAL LOW (ref 13.0–17.0)
Immature Granulocytes: 2 %
Lymphocytes Relative: 16 %
Lymphs Abs: 1.7 K/uL (ref 0.7–4.0)
MCH: 36.1 pg — ABNORMAL HIGH (ref 26.0–34.0)
MCHC: 33.7 g/dL (ref 30.0–36.0)
MCV: 107.1 fL — ABNORMAL HIGH (ref 80.0–100.0)
Monocytes Absolute: 0.8 K/uL (ref 0.1–1.0)
Monocytes Relative: 7 %
Neutro Abs: 7.5 K/uL (ref 1.7–7.7)
Neutrophils Relative %: 72 %
Platelets: 285 K/uL (ref 150–400)
RBC: 2.52 MIL/uL — ABNORMAL LOW (ref 4.22–5.81)
RDW: 13.8 % (ref 11.5–15.5)
WBC: 10.5 K/uL (ref 4.0–10.5)
nRBC: 0.2 % (ref 0.0–0.2)

## 2024-02-19 NOTE — Telephone Encounter (Signed)
 Copied from CRM #[id number]. Topic: Clinical - Medical Advice >> Feb 19, 2024 10:29 AM Devaughn RAMAN wrote: Reason for CRM: Patient's daughter Deane called and stated the patient is weak and having trouble breathing, and he is having trouble breathing continuously. Deane would like the pt to be f/u regarding this.   Pt being seen at ED, will need HFU when discharged.

## 2024-02-19 NOTE — Telephone Encounter (Signed)
 Please schedule patient with me in a slot when in clinic next week 10/22 - 24.   Thanks, JD

## 2024-02-19 NOTE — Discharge Instructions (Signed)
 Your workup was reassuring today.  Some of your blood work is improved from prior.  Follow-up with your doctors.

## 2024-02-19 NOTE — ED Provider Notes (Signed)
 [place] EMERGENCY DEPARTMENT AT [facility] Provider Note   CSN: [id number] Arrival date & time: 02/19/24  1216     Patient presents with: Louis Cruz is a 89 y.o. male.  {Add pertinent medical, surgical, social history, OB history to YEP:67052}  Fall  Patient presents after fall.  Reportedly slid out of his chair landing on his buttocks.  No pain from this.  Is on blood thinners however.  Did not hit his head.  However has had a recent somewhat complex medical history.  Has had pneumonia.  Thought to be potentially organizing versus malignancy versus aspiration.  Has seen pulmonary and been on antibiotics and steroids.  Has also had weight loss.  Unexplained.  Decreased oral intake.  LFTs mildly elevated.       Prior to Admission medications   Medication Sig Start Date End Date Taking? Authorizing Provider  apixaban  (ELIQUIS ) 5 MG TABS tablet Take 1 tablet (5 mg total) by mouth 2 (two) times daily. 02/04/24   Kara Dorn NOVAK, MD  bimatoprost (LUMIGAN) 0.01 % SOLN INSTILL ONE DROP IN EACH EYE AT BEDTIME    [provider]  colesevelam (WELCHOL) 625 MG tablet TAKE TWO TABLETS TWICE DAILY    [provider]  influenza vaccine adjuvanted (FLUAD  QUADRIVALENT) 0.5 ML injection Inject into the muscle. 02/03/22   Luiz Channel, MD  lisinopril (ZESTRIL) 20 MG tablet Take 20 mg by mouth daily. 10/03/21   [provider]  moxifloxacin (VIGAMOX) 0.5 % ophthalmic solution Apply to eye. 10/03/21   [provider]  prednisoLONE acetate (PRED FORTE) 1 % ophthalmic suspension INSTILL ONE DROP IN THE LEFT EYE FOUR TIMES DAILY    [provider]  predniSONE (DELTASONE) 10 MG tablet Take 4 tablets (40 mg total) by mouth daily with breakfast for 10 days, THEN 3 tablets (30 mg total) daily with breakfast for 10 days, THEN 2 tablets (20 mg total) daily with breakfast for 10 days. 02/04/24 03/05/24  Kara Dorn NOVAK, MD  rosuvastatin  (CRESTOR) 20 MG tablet TAKE ONE TABLET EACH DAY    [provider]  sulfamethoxazole-trimethoprim (BACTRIM DS) 800-160 MG tablet Take 1 tablet by mouth 3 (three) times a week. 02/05/24   Kara Dorn NOVAK, MD  timolol (TIMOPTIC-XR) 0.5 % ophthalmic gel-forming     [provider]    Allergies: No known allergies    Review of Systems  Updated Vital Signs BP (!) 115/52   Pulse 78   Temp (!) 97.3 F (36.3 C) (Temporal)   Resp 20   Wt 81.6 kg   SpO2 95%   BMI 24.41 kg/m   Physical Exam Vitals and nursing note reviewed.  Pulmonary:     Breath sounds: No wheezing or rhonchi.  Abdominal:     Tenderness: There is no abdominal tenderness.  Musculoskeletal:        General: No tenderness or signs of injury.     Cervical back: Neck supple.  Skin:    Capillary Refill: Capillary refill takes less than 2 seconds.  Neurological:     Mental Status: He is alert. Mental status is at baseline.     (all labs ordered are listed, but only abnormal results are displayed) Labs Reviewed  CBC WITH DIFFERENTIAL/PLATELET  COMPREHENSIVE METABOLIC PANEL WITH GFR    EKG: None  Radiology: No results found.  {Document cardiac monitor, telemetry assessment procedure when appropriate:32947} Procedures   Medications Ordered in the ED - No data to display    {  Click here for ABCD2, HEART and other calculators REFRESH Note before signing:1}                              Medical Decision Making Amount and/or Complexity of Data Reviewed Labs: ordered. Radiology: ordered.   Patient with fall.  Difficulty getting up which is not unusual for the patient.  Has a life alert bracelet.  Reviewed recent pulmonary note.  Will get repeat x-ray since steroids and antibiotics have been done as an outpatient.  Will check renal function among other labs.  Has mild hypotension which appears somewhat chronic for patient.  {Document critical care time when appropriate  Document review of  labs and clinical decision tools ie CHADS2VASC2, etc  Document your independent review of radiology images and any outside records  Document your discussion with family members, caretakers and with consultants  Document social determinants of health affecting pt's care  Document your decision making why or why not admission, treatments were needed:32947:::1}   Final diagnoses:  None    ED Discharge Orders     None

## 2024-02-19 NOTE — ED Triage Notes (Signed)
 Pt bib spouse, reports concern that pt fell out of chair this morning. Pt denies head injury. Pt referred by PCP for eval d/t taking thinners

## 2024-02-19 NOTE — Telephone Encounter (Signed)
 Copied from CRM #[id number]. Topic: Clinical - Lab/Test Results >> Feb 19, 2024 10:26 AM Devaughn RAMAN wrote: Reason for CRM: Patient's daughter Deane is calling in regarding pt's ultrasound results, she stated the pt is getting depressed and she is worried. Pt can be contacted at [phone]. Patient's chart shows patient is in ED.

## 2024-02-19 NOTE — ED Notes (Signed)
 Pt d/c instructions, medications, and follow-up care reviewed with pt. Pt verbalized understanding and had no further questions at time of d/c. Pt CA&Ox4, ambulatory, and in NAD at time of d/c

## 2024-02-19 NOTE — Telephone Encounter (Signed)
 Pts daughter calling to let us  know she is in ED.

## 2024-02-22 NOTE — Telephone Encounter (Addendum)
 VM / LM - x2 for return call to get an appointment set up for this week patient has been discharged.

## 2024-02-22 NOTE — Telephone Encounter (Signed)
 X3 VM / LM to call and set up Hospital f/u.

## 2024-02-25 ENCOUNTER — Telehealth: Payer: Self-pay

## 2024-02-25 NOTE — Telephone Encounter (Signed)
 Tried to reach out to patient with Number provided VM/ Lm to return call    Reached out to [facility] they transferred me to the patient land line number  machine  LM to return call.     If patience calls back we would like him to be seen right away if daughter calls places get updated number for her

## 2024-03-10 ENCOUNTER — Ambulatory Visit: Payer: Self-pay

## 2024-03-10 ENCOUNTER — Other Ambulatory Visit: Payer: Self-pay | Admitting: Pulmonary Disease

## 2024-03-10 MED ORDER — PREDNISONE 10 MG PO TABS
50.0000 mg | ORAL_TABLET | Freq: Every day | ORAL | 0 refills | Status: AC
Start: 2024-03-10 — End: ?

## 2024-03-10 NOTE — Telephone Encounter (Signed)
 FYI Only or Action Required?: FYI only for provider: urgent care or ED: pt refused .  Patient is followed in Pulmonology for n/a, last seen on 02/04/2024 by Kara Dorn NOVAK, MD.  Called Nurse Triage reporting Shortness of Breath.  Symptoms began x 2 months and worsening.  Interventions attempted: Nothing.  Symptoms are: gradually worsening.  Triage Disposition: See HCP Within 4 Hours (Or PCP Triage)  Patient/caregiver understands and will follow disposition?: No, refuses disposition  E2C2 Pulmonary Triage - Initial Assessment Questions "Chief Complaint (e.g., cough, sob, wheezing, fever, chills, sweat or additional symptoms) *Go to specific symptom protocol after initial questions. N/a  "How long have symptoms been present?" 2 months and worsening   Have you tested for COVID or Flu? Note: If not, ask patient if a home test can be taken. If so, instruct patient to call back for positive results. No  MEDICINES:   "Have you used any OTC meds to help with symptoms?" No If yes, ask "What medications?" na  "Have you used your inhalers/maintenance medication?" No If yes, "What medications?" na  If inhaler, ask "How many puffs and how often?" Note: Review instructions on medication in the chart. na  OXYGEN: "Do you wear supplemental oxygen?" No If yes, "How many liters are you supposed to use?" na  "Do you monitor your oxygen levels?" No If yes, What is your reading (oxygen level) today? na  What is your usual oxygen saturation reading?  (Note: Pulmonary O2 sats should be 90% or greater) na      Copied from CRM #[id number]. Topic: Clinical - Red Word Triage >> Mar 10, 2024 12:26 PM Corean SAUNDERS wrote: Red Word that prompted transfer to Nurse Triage: Shortness of breath on exertion.  Saturation dropped to 81 percent when walking across the living room  Danielle from [place] is speaking. Reason for Disposition  [1] MILD difficulty breathing (e.g., minimal/no  SOB at rest, SOB with walking, pulse < 100) AND [2] NEW-onset or WORSE than normal    Pt needs to be seen by provider: no appt available: nurse recommended urgent care or ED: pt refused  Answer Assessment - Initial Assessment Questions 1. RESPIRATORY STATUS: Describe your breathing? (e.g., wheezing, shortness of breath, unable to speak, severe coughing)      SOB w/exertion, wheezing 2. ONSET: When did this breathing problem begin?      2 months and worsening 3. PATTERN Does the difficult breathing come and go, or has it been constant since it started?      Comes and goes 4. SEVERITY: How bad is your breathing? (e.g., mild, moderate, severe)      moderate 5. RECURRENT SYMPTOM: Have you had difficulty breathing before? If Yes, ask: When was the last time? and What happened that time?      yes 6. CARDIAC HISTORY: Do you have any history of heart disease? (e.g., heart attack, angina, bypass surgery, angioplasty)      no 7. LUNG HISTORY: Do you have any history of lung disease?  (e.g., pulmonary embolus, asthma, emphysema)     yes 8. CAUSE: What do you think is causing the breathing problem?      unknown 9. OTHER SYMPTOMS: Do you have any other symptoms? (e.g., chest pain, cough, dizziness, fever, runny nose)     no  02/04/2024 - pneumonia diagnosis & SOB  97%RA O2 at rest and then 81 w/exertion - lung diminished but no wheezing or crackles 10. O2 SATURATION MONITOR:  Do you use an oxygen  saturation monitor (pulse oximeter) at home? If Yes, ask: What is your reading (oxygen level) today? What is your usual oxygen saturation reading? (e.g., 95%)       See note 11. PREGNANCY: Is there any chance you are pregnant? When was your last menstrual period?       no 12. TRAVEL: Have you traveled out of the country in the last month? (e.g., travel history, exposures)       na  Protocols used: Breathing Difficulty-A-AH

## 2024-03-10 NOTE — Telephone Encounter (Signed)
 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  If he does not want to go to the ED then I will order another prednisone taper and he can follow-up with Dr. Kara as scheduled on 11/18.

## 2024-03-10 NOTE — Telephone Encounter (Signed)
 Dr Theophilus, please advise as Dr Kara is out of office this week.

## 2024-03-10 NOTE — Telephone Encounter (Signed)
 Called pt and there was no answer-LMTCB

## 2024-03-10 NOTE — Telephone Encounter (Signed)
[phone]   

## 2024-03-11 NOTE — Telephone Encounter (Signed)
 I called pt and there was no answer- LMTCB and will close per protocol.

## 2024-03-11 NOTE — Telephone Encounter (Signed)
 Copied from CRM [id number]. Topic: Clinical - Medical Advice >> Mar 11, 2024  8:06 AM Joesph PARAS wrote: Reason for CRM: Patient is calling McRae back. Attempted to explain to patient that, per chart note, provider is sending prednisone taper and would like patient to follow up as scheduled. Patient did not hear PAS the first several times despite basically yelling, then states did not understand. Please call patient, as patient requires thorough explanation. >> Mar 11, 2024 10:22 AM Rilla B wrote: Patient calling to speak with Sonny. Patient has called several times.  Called CAW, no answer.  Please call patient >> Mar 11, 2024  8:29 AM Leila BROCKS wrote: Patient [phone] is returningt the office/Leslie's call, regarding detail explanation; patient called earlier today. Patient needs the office address, provided. Per CAL, CMA is unavailable send a crm. Please call back.    Called and spoke to pt - pt states that he has no questions and that the directions for the prednisone taper are on the bottle. NFN.

## 2024-03-22 ENCOUNTER — Ambulatory Visit: Admitting: Pulmonary Disease

## 2024-03-22 ENCOUNTER — Ambulatory Visit (HOSPITAL_COMMUNITY)

## 2024-03-22 ENCOUNTER — Encounter: Payer: Self-pay | Admitting: Pulmonary Disease

## 2024-03-22 VITALS — BP 132/54 | HR 73 | Ht 72.0 in | Wt 189.0 lb

## 2024-03-22 DIAGNOSIS — I2699 Other pulmonary embolism without acute cor pulmonale: Secondary | ICD-10-CM

## 2024-03-22 DIAGNOSIS — J8489 Other specified interstitial pulmonary diseases: Secondary | ICD-10-CM

## 2024-03-22 DIAGNOSIS — J9611 Chronic respiratory failure with hypoxia: Secondary | ICD-10-CM

## 2024-03-22 NOTE — Progress Notes (Signed)
 Established Patient Pulmonology Office Visit   Subjective:  Patient ID: Louis Cruz, male    DOB: [birth year]  MRN: [id number]  CC:  Chief Complaint  Patient presents with   Medical Management of Chronic Issues    PT states SOB    Discussed the use of AI scribe software for clinical note transcription with the patient, who gave verbal consent to proceed.  History of Present Illness Louis Cruz is an 89 year old male with a history of pneumonia who presents with ongoing shortness of breath.  He experiences persistent shortness of breath following treatment for pneumonia, with no improvement despite a steroid taper and Bactrim prophylaxis. A chest radiograph on February 19, 2024, showed improvement in right-sided interstitial infiltrates. A previous CT scan indicated inflammation primarily in the right lung. Tests for hypersensitivity pneumonitis, ANA, ANCA, and rheumatoid factor are negative.  He denies fever or chills and has no significant mucus production, except minor mucus upon waking. He has lost approximately 20 pounds since the onset of his illness. He participates in physical therapy three times a week for deconditioning. He experiences significant shortness of breath with exertion, requiring frequent rest during activities.  During a simple walk test, his oxygen saturation dropped to 83% on room air.        Review of Systems  Constitutional:  Negative for chills, fever, malaise/fatigue and weight loss.  HENT:  Negative for congestion, sinus pain and sore throat.   Eyes: Negative.   Respiratory:  Positive for shortness of breath. Negative for cough, hemoptysis, sputum production and wheezing.   Cardiovascular:  Negative for chest pain, palpitations, orthopnea, claudication and leg swelling.  Gastrointestinal:  Negative for abdominal pain, heartburn, nausea and vomiting.  Genitourinary: Negative.   Musculoskeletal:  Negative for joint pain and myalgias.  Skin:   Negative for rash.  Neurological:  Negative for weakness.  Endo/Heme/Allergies: Negative.   Psychiatric/Behavioral: Negative.        Current Outpatient Medications:    apixaban  (ELIQUIS ) 5 MG TABS tablet, Take 1 tablet (5 mg total) by mouth 2 (two) times daily., Disp: 60 tablet, Rfl: 3   influenza vaccine adjuvanted (FLUAD  QUADRIVALENT) 0.5 ML injection, Inject into the muscle., Disp: 0.5 mL, Rfl: 0   lisinopril (ZESTRIL) 20 MG tablet, Take 20 mg by mouth daily., Disp: , Rfl:    moxifloxacin (VIGAMOX) 0.5 % ophthalmic solution, Apply to eye., Disp: , Rfl:    predniSONE (DELTASONE) 10 MG tablet, Take 5 tablets (50 mg total) by mouth daily with breakfast. Take 50 mg/day on day 1 and reduce dose by 10 mg every 10 days until taper is complete, Disp: 150 tablet, Rfl: 0   rosuvastatin (CRESTOR) 20 MG tablet, TAKE ONE TABLET EACH DAY, Disp: , Rfl:    sulfamethoxazole-trimethoprim (BACTRIM DS) 800-160 MG tablet, Take 1 tablet by mouth 3 (three) times a week., Disp: 12 tablet, Rfl: 1   bimatoprost (LUMIGAN) 0.01 % SOLN, INSTILL ONE DROP IN EACH EYE AT BEDTIME (Patient not taking: Reported on 03/22/2024), Disp: , Rfl:    colesevelam (WELCHOL) 625 MG tablet, TAKE TWO TABLETS TWICE DAILY (Patient not taking: Reported on 03/22/2024), Disp: , Rfl:    prednisoLONE acetate (PRED FORTE) 1 % ophthalmic suspension, INSTILL ONE DROP IN THE LEFT EYE FOUR TIMES DAILY (Patient not taking: Reported on 03/22/2024), Disp: , Rfl:    timolol (TIMOPTIC-XR) 0.5 % ophthalmic gel-forming, , Disp: , Rfl:       Objective:  BP (!) 132/54  Pulse 73   Ht 6' (1.829 m) Comment: per pt  Wt 189 lb (85.7 kg)   SpO2 93%   BMI 25.63 kg/m     Physical Exam Constitutional:      General: He is not in acute distress.    Appearance: Normal appearance.  Eyes:     General: No scleral icterus.    Conjunctiva/sclera: Conjunctivae normal.  Cardiovascular:     Rate and Rhythm: Normal rate and regular rhythm.  Pulmonary:      Breath sounds: Rales (mild crackles right base) present. No wheezing or rhonchi.  Musculoskeletal:     Right lower leg: No edema.     Left lower leg: No edema.  Skin:    General: Skin is warm and dry.  Neurological:     General: No focal deficit present.    Office Visit from 03/22/2024 in [facility] at [facility]   03/22/2024    1419  Resting   Supplemental oxygen during test? --  Resting Heart Rate 76  Resting Sp02 95  Lap 1 (250 feet)   HR 93  02 Sat 83  Lap 2 (250 feet)   HR --  02 Sat --  Lap 3 (250 feet)   HR --  02 Sat --  Tech Comments: patient could not finish first lap without O2 dropping placed on 2L continous / 3L POC O2 back to 97%     Diagnostic Review:  Last CBC Lab Results  Component Value Date   WBC 10.5 02/19/2024   HGB 9.1 (L) 02/19/2024   HCT 27.0 (L) 02/19/2024   MCV 107.1 (H) 02/19/2024   MCH 36.1 (H) 02/19/2024   RDW 13.8 02/19/2024   PLT 285 02/19/2024   Last metabolic panel Lab Results  Component Value Date   GLUCOSE 102 (H) 02/19/2024   NA 133 (L) 02/19/2024   K 4.2 02/19/2024   CL 100 02/19/2024   CO2 22 02/19/2024   BUN 14 02/19/2024   CREATININE 0.63 02/19/2024   GFRNONAA >60 02/19/2024   CALCIUM 9.0 02/19/2024   PROT 6.6 02/19/2024   ALBUMIN 3.2 (L) 02/19/2024   BILITOT 0.5 02/19/2024   ALKPHOS 81 02/19/2024   AST 24 02/19/2024   ALT 26 02/19/2024   ANIONGAP 12 02/19/2024    CXR 02/19/24 1. Similar volume loss right hemithorax with peripheral predominant interstitial and pleuroparenchymal opacity in the right chest. 2. Interval decrease in right basilar airspace disease. 3. Probable tiny bilateral pleural effusions.  CXR 02/04/24 Increased right basilar opacity is noted concerning for worsening pneumonia. Right upper lobe opacity is also noted concerning for scarring or possibly pneumonia. Followup PA and lateral chest X-ray is recommended in 3-4 weeks following trial of antibiotic therapy  to ensure resolution and exclude underlying malignancy.     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 Improved right-sided infiltrates on recent chest x-ray. Elevated SED rate. Negative hypersensitivity pneumonitis panel, ANA, ANCA, and rheumatoid factor. Possible organizing pneumonia.. Discussed potential lung scarring. Differential includes aspiration due to swallowing issues. - Ordered CT chest to assess scarring and compare with previous imaging. - Ordered modified barium swallow test to  evaluate swallowing function and aspiration risk. - Recheck ESR inflammatory marker - Continue physical therapy exercises thrice weekly.  Chronic respiratory failure with hypoxia Oxygen desaturation to 83% on exertion. Critical oxygen saturation threshold of 88% discussed. Dyspnea likely due to deconditioning and potential lung scarring. CT scan to assess lung changes. Discussed possible need for oxygen therapy during activity. - Ordered portable oxygen concentrator for ambulation and physical therapy. - Ordered echocardiogram to assess cardiac function. - Scheduled follow-up in one month to review results and adjust treatment.      Return in about 1 month (around 04/21/2024).   Dorn KATHEE Chill, MD

## 2024-03-22 NOTE — Patient Instructions (Addendum)
 I am concerned you have organizing pneumonia  We will order you oxygen to use when walking or active.  We will schedule you for a CT Chest scan  We will schedule you for an Echocardiogram  We will check a lab test today  We will schedule you for a modified barium swallow test to make sure there is no aspiration.  Follow up in 1 month

## 2024-03-23 ENCOUNTER — Telehealth (HOSPITAL_COMMUNITY): Payer: Self-pay | Admitting: Pulmonary Disease

## 2024-03-23 ENCOUNTER — Other Ambulatory Visit (HOSPITAL_COMMUNITY): Payer: Self-pay | Admitting: Internal Medicine

## 2024-03-23 ENCOUNTER — Ambulatory Visit (HOSPITAL_COMMUNITY)
Admission: RE | Admit: 2024-03-23 | Discharge: 2024-03-23 | Disposition: A | Discharge status: Home / Self Care | Source: Ambulatory Visit | Attending: Pulmonary Disease | Admitting: Pulmonary Disease

## 2024-03-23 DIAGNOSIS — J8489 Other specified interstitial pulmonary diseases: Secondary | ICD-10-CM | POA: Insufficient documentation

## 2024-03-23 DIAGNOSIS — R1310 Dysphagia, unspecified: Secondary | ICD-10-CM

## 2024-03-23 DIAGNOSIS — R059 Cough, unspecified: Secondary | ICD-10-CM

## 2024-03-25 ENCOUNTER — Telehealth: Payer: Self-pay

## 2024-03-25 ENCOUNTER — Telehealth: Payer: Self-pay | Admitting: Pulmonary Disease

## 2024-03-25 ENCOUNTER — Other Ambulatory Visit: Payer: Self-pay

## 2024-03-25 DIAGNOSIS — J9611 Chronic respiratory failure with hypoxia: Secondary | ICD-10-CM

## 2024-03-25 DIAGNOSIS — I2699 Other pulmonary embolism without acute cor pulmonale: Secondary | ICD-10-CM

## 2024-03-25 NOTE — Telephone Encounter (Signed)
 Copied from CRM #[id number]. Topic: Clinical - Medical Advice >> Mar 24, 2024  1:12 PM Rilla B wrote: Reason for CRM: Patient would like a call back asap regarding oxygen treatment he is on.  States he needs to talk to nurse or Dr Kara as soon as possible. >> Mar 24, 2024  2:56 PM Benton KIDD wrote: Patient calling back to speak with dr or nurse concerning getting a fax to the dme company for them to come pick up the equipment he has  [phone] >> Mar 24, 2024  2:52 PM Celestine F wrote: Pt called back requesting to speak with Dr. Kara or his CMA. I attempted to call CAL multiple times with no answer. Pt prefers to speak to one of these two options if possible. When I came back to the line there was an issue with the phone and I couldn't hear the pt anymore. I called the pt back with no answer.    Spoke with patient and wife already been address, order to D/C O2 has been sent    -NFN

## 2024-03-25 NOTE — Telephone Encounter (Signed)
 Patient is here in office. He has oxygen in his house and would like for it to be removed. Please contact patient with an update. He can be reached at the number on file.

## 2024-03-25 NOTE — Telephone Encounter (Signed)
 Spoke with patient D/C O2 and requested if he changes his mind to please let us  know.   -NFN

## 2024-03-25 NOTE — Telephone Encounter (Signed)
 Pt called back and stated that he needed a letter sent over to the company stating that his doctor ordered it to be discontinued. RN advised do see the referral but unsure if it's been faxed or not. He stated it needed to be faxed today and he did have a fax number. RN advised the office staff will have to do that on Monday. Pt asked for Kelly's direct number, RN advised could give him the clinic phone number. He asked that whatever letter is sent the company he would also like a copy sent to him by email. RN advised it will likely be a clinical cytogeneticist message or mailed. Pt stated understanding.   Fax number is [phone].

## 2024-03-28 ENCOUNTER — Telehealth: Payer: Self-pay

## 2024-03-28 NOTE — Telephone Encounter (Signed)
 Copied from CRM #[id number]. Topic: Clinical - Medical Advice >> Mar 24, 2024  1:12 PM Rilla B wrote: Reason for CRM: Patient would like a call back asap regarding oxygen treatment he is on.  States he needs to talk to nurse or Dr Kara as soon as possible. >> Mar 28, 2024  8:41 AM Leotis ORN wrote: Patient is calling back regarding the removal of his home oxygen equipment. He stated he spoke with the clinic on Friday about having the oxygen discontinued and a letter faxed to the company. Today, the patient reports the oxygen tank is still in his home and he is requesting that it be removed. He is asking for a call back to confirm when this will be handled, as he stated it is very important  >> Mar 24, 2024  2:56 PM Benton O wrote: Patient calling back to speak with dr or nurse concerning getting a fax to the dme company for them to come pick up the equipment he has  [phone] >> Mar 24, 2024  2:52 PM Celestine F wrote: Pt called back requesting to speak with Dr. Kara or his CMA. I attempted to call CAL multiple times with no answer. Pt prefers to speak to one of these two options if possible. When I came back to the line there was an issue with the phone and I couldn't hear the pt anymore. I called the pt back with no answer.     discontinue oxygen  on 11/21 - takes a few   Business  days to process.

## 2024-03-28 NOTE — Telephone Encounter (Signed)
 This has already been address    Order has been placed to discontinue O2 it was placed 11/21 it takes a few business days to process    Patient has been told this

## 2024-03-28 NOTE — Telephone Encounter (Signed)
 Ok to discontinue as risks have been explained to patient.  Dorn Chill, MD [facility] Office: [phone]   See Amion for personal pager PCCM on call pager [phone] until 7pm. Please call Elink 7p-7a. [phone]

## 2024-03-29 NOTE — Telephone Encounter (Signed)
 O2 has been D/C   -NFN

## 2024-03-30 NOTE — Telephone Encounter (Signed)
 Copied from CRM #[id number]. Topic: General - Other >> Mar 30, 2024  8:07 AM Leila C wrote: Reason for CRM: Patient [phone] wanted to let Burnard know the oxygen was taken yesterday, thank you and patient is doing fine. FYI.

## 2024-04-06 ENCOUNTER — Ambulatory Visit (HOSPITAL_COMMUNITY): Admission: RE | Admit: 2024-04-06 | Source: Ambulatory Visit

## 2024-04-08 ENCOUNTER — Ambulatory Visit: Payer: Self-pay | Admitting: Pulmonary Disease

## 2024-04-08 DIAGNOSIS — J8489 Other specified interstitial pulmonary diseases: Secondary | ICD-10-CM

## 2024-04-08 NOTE — Telephone Encounter (Signed)
 Please let patient know his CT Chest scan is now showing evidence of scarring of the right lung. His esophagus is also dilated in some areas, concerning for possible reflux disease or nocturnal reflux disease that could be getting into his right lung and causing inflammation.  I have ordered more labs that I would like for him to make an appointment to come in and get done before next follow up visit.   Thanks,  JD

## 2024-04-11 ENCOUNTER — Ambulatory Visit (HOSPITAL_COMMUNITY)
Admission: RE | Admit: 2024-04-11 | Discharge: 2024-04-11 | Disposition: A | Source: Ambulatory Visit | Attending: Internal Medicine

## 2024-04-11 ENCOUNTER — Ambulatory Visit (HOSPITAL_COMMUNITY): Admission: RE | Admit: 2024-04-11 | Source: Ambulatory Visit

## 2024-04-11 DIAGNOSIS — J8489 Other specified interstitial pulmonary diseases: Secondary | ICD-10-CM

## 2024-04-12 NOTE — Addendum Note (Signed)
 Addended by: Aysia Lowder on: 04/12/2024 01:08 PM   Modules accepted: Orders

## 2024-04-14 ENCOUNTER — Other Ambulatory Visit

## 2024-04-29 ENCOUNTER — Encounter (HOSPITAL_BASED_OUTPATIENT_CLINIC_OR_DEPARTMENT_OTHER): Payer: Self-pay

## 2024-04-29 ENCOUNTER — Emergency Department (HOSPITAL_BASED_OUTPATIENT_CLINIC_OR_DEPARTMENT_OTHER)
Admission: EM | Admit: 2024-04-29 | Discharge: 2024-04-29 | Discharge status: Against Medical Advice | Attending: Emergency Medicine | Admitting: Emergency Medicine

## 2024-04-29 ENCOUNTER — Other Ambulatory Visit: Payer: Self-pay

## 2024-04-29 DIAGNOSIS — W108XXA Fall (on) (from) other stairs and steps, initial encounter: Secondary | ICD-10-CM | POA: Diagnosis not present

## 2024-04-29 DIAGNOSIS — Z5321 Procedure and treatment not carried out due to patient leaving prior to being seen by health care provider: Secondary | ICD-10-CM | POA: Diagnosis not present

## 2024-04-29 DIAGNOSIS — Z7901 Long term (current) use of anticoagulants: Secondary | ICD-10-CM | POA: Insufficient documentation

## 2024-04-29 DIAGNOSIS — S0990XA Unspecified injury of head, initial encounter: Secondary | ICD-10-CM | POA: Insufficient documentation

## 2024-04-29 NOTE — ED Triage Notes (Signed)
 Pt reports slipping and falling down a few steps x2 weeks ago and hit head. Pt denies any LOC. Pt does take Eliquis . Pt denies any complaints.

## 2024-05-02 ENCOUNTER — Other Ambulatory Visit: Payer: Self-pay

## 2024-05-02 ENCOUNTER — Emergency Department (HOSPITAL_BASED_OUTPATIENT_CLINIC_OR_DEPARTMENT_OTHER)

## 2024-05-02 ENCOUNTER — Emergency Department (HOSPITAL_BASED_OUTPATIENT_CLINIC_OR_DEPARTMENT_OTHER): Admitting: Radiology

## 2024-05-02 ENCOUNTER — Emergency Department (HOSPITAL_BASED_OUTPATIENT_CLINIC_OR_DEPARTMENT_OTHER)
Admission: EM | Admit: 2024-05-02 | Discharge: 2024-05-02 | Disposition: A | Discharge status: Home / Self Care | Attending: Emergency Medicine | Admitting: Emergency Medicine

## 2024-05-02 ENCOUNTER — Encounter (HOSPITAL_BASED_OUTPATIENT_CLINIC_OR_DEPARTMENT_OTHER): Payer: Self-pay | Admitting: Emergency Medicine

## 2024-05-02 DIAGNOSIS — S0990XA Unspecified injury of head, initial encounter: Secondary | ICD-10-CM | POA: Diagnosis not present

## 2024-05-02 DIAGNOSIS — W1830XA Fall on same level, unspecified, initial encounter: Secondary | ICD-10-CM | POA: Insufficient documentation

## 2024-05-02 DIAGNOSIS — I10 Essential (primary) hypertension: Secondary | ICD-10-CM | POA: Insufficient documentation

## 2024-05-02 DIAGNOSIS — R5383 Other fatigue: Secondary | ICD-10-CM | POA: Diagnosis present

## 2024-05-02 DIAGNOSIS — Z7901 Long term (current) use of anticoagulants: Secondary | ICD-10-CM | POA: Insufficient documentation

## 2024-05-02 DIAGNOSIS — I6789 Other cerebrovascular disease: Secondary | ICD-10-CM | POA: Diagnosis not present

## 2024-05-02 DIAGNOSIS — W19XXXA Unspecified fall, initial encounter: Secondary | ICD-10-CM

## 2024-05-02 DIAGNOSIS — Z79899 Other long term (current) drug therapy: Secondary | ICD-10-CM | POA: Insufficient documentation

## 2024-05-02 MED ORDER — ACETAMINOPHEN 500 MG PO TABS: 1000.0000 mg | ORAL_TABLET | Freq: Once | ORAL | Status: DC

## 2024-05-02 NOTE — ED Provider Triage Note (Signed)
 Emergency Medicine Provider Triage Evaluation Note  Louis Cruz , a 89 y.o. male  was evaluated in triage.  Pt complains of fall 2 weeks ago, on eliquis , diffuse pain with movement since event and confusion.  Review of Systems  Positive: Diffuse pain, eliquis  use Negative: Chest pain, inability to walks  Physical Exam  BP 129/68 (BP Location: Right Arm)   Pulse (!) 103   Temp 98.3 F (36.8 C) (Oral)   Resp 19   Wt 85.7 kg   SpO2 94%   BMI 25.63 kg/m  Gen:   Awake, no distress   Resp:  Normal effort  MSK:   Moves extremities without difficulty   Medical Decision Making  Medically screening exam initiated at 10:07 AM.  Appropriate orders placed.  Louis Cruz was informed that the remainder of the evaluation will be completed by another provider, this initial triage assessment does not replace that evaluation, and the importance of remaining in the ED until their evaluation is complete.     Louis Jerilynn RAMAN, MD 05/02/24 [phone]

## 2024-05-02 NOTE — ED Notes (Signed)
 DC paperwork given and verbally understood.

## 2024-05-02 NOTE — Discharge Instructions (Signed)
 Louis Cruz  Thank you for allowing us  to take care of you today.  You came to the Emergency Department today because you had a fall a couple of weeks ago and since then you have been slightly fatigued and sore.  You can continue to use Tylenol for pain control.  You do not have bleeding in your head, broken bones in your neck, or broken bones in your chest or pelvis..  You can continue to follow-up with your primary care doctor for further management, it is always possible that you could have a mild concussion, this is diagnosed based off of symptoms, for concussion you can use Tylenol, rest, and gradual return to activity.   To-Do: 1. Please follow-up with your primary doctor within 7 days / as soon as possible.   Please return to the Emergency Department or call 911 if you experience have worsening of your symptoms, or do not get better, chest pain, shortness of breath, severe or significantly worsening pain, high fever, severe confusion, pass out or have any reason to think that you need emergency medical care.   We hope you feel better soon.   Mitzie Later, MD Department of Emergency Medicine [facility]

## 2024-05-02 NOTE — ED Provider Notes (Signed)
 " [place] EMERGENCY DEPARTMENT AT [facility] Provider Note   CSN: [id number] Arrival date & time: 05/02/24  0944     History Chief Complaint  Patient presents with   Fall     Fall   HPI: Louis Cruz is a 89 y.o. male with history perinent for Eliquis  use, hypertension, hyperlipidemia who presents complaining of recent fall. Patient arrived via POV, knee by wife.  History provided by patient and spouse/partner.  No interpreter required during this encounter.  Patient and wife present to the emergency department for evaluation status post fall.  Patient had a fall approximately 2 weeks ago, came to the emergency department on 12/26, however left without being seen due to prolonged wait times.  Her wife, patient has not been quite as lively/spunky as baseline, therefore she is concerned that he has a concussion.  Reports that they went to their primary care doctor who recommended that they come to the emergency department for further evaluation.  Patient denies confusion, pain, chest pain, shortness of breath, disequilibrium.  Reports that he does have mild diffuse aches for which he treats with Tylenol at home.  Prior to Admission medications  Medication Sig Start Date End Date Taking? Authorizing Provider  apixaban  (ELIQUIS ) 5 MG TABS tablet Take 1 tablet (5 mg total) by mouth 2 (two) times daily. 02/04/24   Kara Dorn NOVAK, MD  bimatoprost (LUMIGAN) 0.01 % SOLN INSTILL ONE DROP IN EACH EYE AT BEDTIME Patient not taking: Reported on 03/22/2024    [provider]  colesevelam (WELCHOL) 625 MG tablet TAKE TWO TABLETS TWICE DAILY Patient not taking: Reported on 03/22/2024    [provider]  influenza vaccine adjuvanted (FLUAD  QUADRIVALENT) 0.5 ML injection Inject into the muscle. 02/03/22   Luiz Channel, MD  lisinopril (ZESTRIL) 20 MG tablet Take 20 mg by mouth daily. 10/03/21   [provider]  moxifloxacin (VIGAMOX) 0.5 % ophthalmic  solution Apply to eye. 10/03/21   [provider]  prednisoLONE acetate (PRED FORTE) 1 % ophthalmic suspension INSTILL ONE DROP IN THE LEFT EYE FOUR TIMES DAILY Patient not taking: Reported on 03/22/2024    [provider]  predniSONE  (DELTASONE ) 10 MG tablet Take 5 tablets (50 mg total) by mouth daily with breakfast. Take 50 mg/day on day 1 and reduce dose by 10 mg every 10 days until taper is complete 03/10/24   Mannam, Praveen, MD  rosuvastatin (CRESTOR) 20 MG tablet TAKE ONE TABLET EACH DAY    [provider]  sulfamethoxazole -trimethoprim  (BACTRIM  DS) 800-160 MG tablet Take 1 tablet by mouth 3 (three) times a week. 02/05/24   Kara Dorn NOVAK, MD  timolol (TIMOPTIC-XR) 0.5 % ophthalmic gel-forming     [provider]     Allergies: No known allergies   Review of Systems   ROS as per HPI  Physical Exam Updated Vital Signs BP 129/68 (BP Location: Right Arm)   Pulse (!) 103   Temp 98.3 F (36.8 C) (Oral)   Resp 19   Wt 85.7 kg   SpO2 94%   BMI 25.63 kg/m  Physical Exam Vitals and nursing note reviewed.  Constitutional:      General: He is not in acute distress.    Appearance: He is well-developed.  HENT:     Head: Normocephalic and atraumatic.  Eyes:     Conjunctiva/sclera: Conjunctivae normal.  Cardiovascular:     Rate and Rhythm: Normal rate and regular rhythm.     Heart sounds: No  murmur heard. Pulmonary:     Effort: Pulmonary effort is normal. No respiratory distress.     Breath sounds: Normal breath sounds.  Abdominal:     Palpations: Abdomen is soft.     Tenderness: There is no abdominal tenderness.  Musculoskeletal:        General: No swelling.     Cervical back: Neck supple.  Skin:    General: Skin is warm and dry.     Capillary Refill: Capillary refill takes less than 2 seconds.  Neurological:     Mental Status: He is alert.     Gait: Gait normal.     Comments: Oriented to self, place, time, situation  Psychiatric:         Mood and Affect: Mood normal.     ED Course/ Medical Decision Making/ A&P    Procedures Procedures   Medications Ordered in ED Medications  acetaminophen (TYLENOL) tablet 1,000 mg (has no administration in time range)    Medical Decision Making:   Louis Cruz is a 89 y.o. male who presents for fall as per above.  Physical exam is pertinent for no focal abnormalities.   The differential includes but is not limited to ICH, TBI, skull fracture, spinal fracture/dislocation, blunt thoracic trauma, hemothorax, pneumothorax, rib fractures, blunt abdominal trauma, hemorrhage, extremity fracture, dislocation.  Independent historian: Spouse/partner  External data reviewed: No pertinent external data  Labs: Not indicated  Radiology: Ordered, Independent interpretation, and All images reviewed independently.  Agree with radiology report at this time.   CXR: No acute cardiac or pulmonary abnormality. No appreciable rib fx. No PTX.  Pelvis XR: Pelvic ring intact. No acute fx. Both hips located.  CT head: No ICH or displaced skull fracture CT C-spine: No displaced fracture or dislocation CT Cervical Spine Wo Contrast Result Date: 05/02/2024 EXAM: CT CERVICAL SPINE WITHOUT CONTRAST 05/02/2024 10:41:00 AM TECHNIQUE: CT of the cervical spine was performed without intravenous contrast. Multiplanar reformatted images are provided for review. Automated exposure control, iterative reconstruction, and/or weight based adjustment of the mA/kV was utilized to reduce the radiation dose to as low as reasonably achievable. COMPARISON: Head CT reported separately today. High resolution chest CT 03/23/2024. CLINICAL HISTORY: 89 year old male, recent falls, on Eliquis , neck trauma. FINDINGS: BONES AND ALIGNMENT: No acute osseous abnormality. Mild dextroconvex cervical scoliosis. Spina bifida occulta at C2, normal variant. Only mild cervical spinal stenosis by CT. DEGENERATIVE CHANGES: Widespread  degenerative cervical spinal ankylosis, primarily left sided facet ankylosis including at C3-C4 and C5-C6. Degenerative and hyperostosis appearing C5-C6 interbody ankylosis. Only mild cervical spinal stenosis by CT. SOFT TISSUES: Moderate calcified cervical carotid artery atherosclerosis, otherwise negative visible non-contrast neck soft tissues. Partially visible extensive interstitial changes in the right lung apex. Stable visible upper thoracic levels. IMPRESSION: 1. No acute traumatic injury identified in the cervical spine. 2. Widespread cervical spine degeneration and ankylosis. Only mild cervical spinal stenosis by CT. Electronically signed by: Helayne Hurst MD 05/02/2024 10:57 AM EST RP Workstation: [id number]   DG Chest 2 View Result Date: 05/02/2024 EXAM: 2 VIEW(S) XRAY OF THE CHEST 05/02/2024 10:42:00 AM COMPARISON: 02/19/2024, Chest CT 03/23/2024, and earlier studies. CLINICAL HISTORY: 89 year old male with recent falls and on anticoagulation. Abnormal right lung on high resolution chest CT last month. FINDINGS: LUNGS AND PLEURA: Stable peripheral interstitial opacities in right lung. Stable asymmetrically decreased right lung volume. Ventilation not significantly changed compared to september. No pleural effusion. No pneumothorax. HEART AND MEDIASTINUM: No acute abnormality of the cardiac and  mediastinal silhouettes. BONES AND SOFT TISSUES: Thoracic degenerative changes. IMPRESSION: 1. Stable right lung volume loss and interstitial opacity , see high-resolution chest CT last month. Consider developing pulmonary fibrosis. 2. No new cardiopulmonary abnormality. Electronically signed by: Helayne Hurst MD 05/02/2024 10:53 AM EST RP Workstation: [id number]   DG Pelvis 1-2 Views Result Date: 05/02/2024 EXAM: 2 VIEW(S) XRAY OF THE PELVIS 05/02/2024 10:42:00 AM COMPARISON: CT abdomen and pelvis 11/12/2021. CLINICAL HISTORY: 89 year old male with recent falls and on anticoagulation. FINDINGS: BONES AND  JOINTS: No acute fracture. No malalignment. Degenerative changes of the lower lumbar spine. Moderate degenerative changes of bilateral hips. SOFT TISSUES: Vascular calcifications. Incidental external zippler artifact. IMPRESSION: 1. No acute fracture or dislocation identified about the pelvis. Electronically signed by: Helayne Hurst MD 05/02/2024 10:51 AM EST RP Workstation: [id number]   CT Head Wo Contrast Result Date: 05/02/2024 EXAM: CT HEAD WITHOUT CONTRAST 05/02/2024 10:41:00 AM TECHNIQUE: CT of the head was performed without the administration of intravenous contrast. Automated exposure control, iterative reconstruction, and/or weight based adjustment of the mA/kV was utilized to reduce the radiation dose to as low as reasonably achievable. COMPARISON: None available. CLINICAL HISTORY: 89 year old male with recent falls and unanticoagulation. FINDINGS: BRAIN AND VENTRICLES: Normal brain volume for age. Confluent bilateral cerebral white matter hypodensity is moderately advanced for age. Comparatively mild heterogeneity in the deep gray matter nuclei. Small area of chronic encephalomalacia at the anterior left temporal tip, nonspecific but most commonly posttraumatic. Calcified atherosclerosis at the skull base. No suspicious intracranial vascular hyperdensity. No acute hemorrhage. No evidence of acute infarct. No hydrocephalus. No extra-axial collection. No mass effect or midline shift. ORBITS: No acute abnormality. SINUSES: Paranasal sinuses clear. SOFT TISSUES AND SKULL: Small 3 mm retained subcutaneous metallic foreign body overlying the right zygomatic arch on series 3 image 16. Tympanic cavities and mastoids clear. No skull fracture. IMPRESSION: 1. No acute intracranial abnormality or acute traumatic injury identified. 2. Moderate for age changes of chronic small vessel disease. Electronically signed by: Helayne Hurst MD 05/02/2024 10:50 AM EST RP Workstation: [id number]    EKG/Medicine tests: Not  indicated                Interventions: None  See the EMR for full details regarding lab and imaging results.  Currently, patient is awake, alert, and protecting own airway and is hemodynamically stable.  Vitally stable, well-appearing, patient has no complaints, is GCS 15 and oriented on exam, wife admits that patient has no confusion, however sleep slightly more fatigued than baseline, however given patient reports that he feels to be at his baseline, not altered per wife, does warrant screening labs and imaging.  This is obtained and reassuring, given patient otherwise without complaints, mental status is reassuring, feel that patient is stable for discharge and outpatient follow-up with PCP, advised Tylenol as needed for pain control.  For Tylenol in the ED, declined.  Presentation is most consistent with acute complicated illness and I did consider and rule out acute life/limb-threatening illness  Discussion of management or test interpretations with external provider(s): Not indicated  Risk Drugs:None  Disposition: DISCHARGE: I believe that the patient is safe for discharge home with outpatient follow-up. Patient was informed of all pertinent physical exam, laboratory, and imaging findings. Patient's suspected etiology of their symptom presentation was discussed with the patient and all questions were answered. We discussed following up with PCP. I provided thorough ED return precautions. The patient feels safe and comfortable with this plan.  MDM generated using voice  dictation software and may contain dictation errors.  Please contact me for any clarification or with any questions.  Clinical Impression:  1. Fall, initial encounter   2. Other fatigue      Discharge   Final Clinical Impression(s) / ED Diagnoses Final diagnoses:  Fall, initial encounter  Other fatigue    Rx / DC Orders ED Discharge Orders     None        [name], MD 05/02/24 1310  "

## 2024-05-02 NOTE — ED Triage Notes (Signed)
 Pt endorses fall 2 weeks pta. Pt was not evaluated, LWBS on 12/26. Pt takes eliquis .Wife states pt sleeping all day

## 2024-05-12 ENCOUNTER — Ambulatory Visit (HOSPITAL_COMMUNITY)

## 2024-05-12 ENCOUNTER — Ambulatory Visit (HOSPITAL_COMMUNITY)
Admission: RE | Admit: 2024-05-12 | Discharge: 2024-05-12 | Disposition: A | Discharge status: Home / Self Care | Source: Ambulatory Visit | Attending: Internal Medicine | Admitting: Internal Medicine

## 2024-05-12 ENCOUNTER — Telehealth: Payer: Self-pay

## 2024-05-12 DIAGNOSIS — J8489 Other specified interstitial pulmonary diseases: Secondary | ICD-10-CM

## 2024-05-12 NOTE — Telephone Encounter (Signed)
 Copied from CRM [id number]. Topic: Clinical - Request for Lab/Test Order >> May 11, 2024  3:08 PM Louis Cruz wrote: Reason for CRM: Patient is requesting Dr. Kara to place an order for an echo and to please call him back to schedule as he needs this completed for his PCP Dr. Onita.   Tried to reach out to patient VM/ LM return call    [name] had placed a ECHO order and it still good until 03/2025  Patient needs to reach out to reschedule with the clinic. He had canceled last appointment

## 2024-05-17 ENCOUNTER — Ambulatory Visit: Admitting: Pulmonary Disease

## 2024-05-19 ENCOUNTER — Telehealth (HOSPITAL_COMMUNITY): Payer: Self-pay | Admitting: *Deleted

## 2024-05-19 NOTE — Telephone Encounter (Signed)
 Attempted to contact patient to reschedule his no show appointment for OP MBS. Left VM @ [phone] requesting a call back. RKEEL

## 2024-05-26 ENCOUNTER — Telehealth (HOSPITAL_COMMUNITY): Payer: Self-pay

## 2024-05-26 NOTE — Telephone Encounter (Signed)
 Attempted to contact patient to reschedule his no show appointment for OP MBS. Left VM @ [phone] requesting a call back by January 30th, 2026 - if no call back, the order will be canceled and patient will need to seek out new order from Dr. Luann office.

## 2024-06-03 ENCOUNTER — Telehealth (HOSPITAL_COMMUNITY): Payer: Self-pay

## 2024-06-03 NOTE — Telephone Encounter (Signed)
 Order for OP MBS (swallow study) canceled due to 04/11/24 call out, 05/12/24 no show, and multiple out reaches to reschedule, voicemail's, and no call back. Patient will need to get new order from Dr. Dorn Cashing office if he wants to move forward with swallow study.

## 2024-06-13 ENCOUNTER — Ambulatory Visit: Admitting: Pulmonary Disease

## 2024-08-05 ENCOUNTER — Ambulatory Visit: Admitting: Pulmonary Disease
